# Patient Record
Sex: Female | Born: 1950 | Hispanic: No | Marital: Married | Smoking: Never smoker
Health system: Southern US, Community
[De-identification: ages and names within clinical notes are randomized; demographics above are authoritative.]

## PROBLEM LIST (undated history)

## (undated) DIAGNOSIS — M419 Scoliosis, unspecified: Secondary | ICD-10-CM

## (undated) DIAGNOSIS — E785 Hyperlipidemia, unspecified: Secondary | ICD-10-CM

## (undated) DIAGNOSIS — M2540 Effusion, unspecified joint: Secondary | ICD-10-CM

## (undated) DIAGNOSIS — F329 Major depressive disorder, single episode, unspecified: Secondary | ICD-10-CM

## (undated) DIAGNOSIS — R350 Frequency of micturition: Secondary | ICD-10-CM

## (undated) DIAGNOSIS — I10 Essential (primary) hypertension: Secondary | ICD-10-CM

## (undated) DIAGNOSIS — M1990 Unspecified osteoarthritis, unspecified site: Secondary | ICD-10-CM

## (undated) DIAGNOSIS — J45909 Unspecified asthma, uncomplicated: Secondary | ICD-10-CM

## (undated) DIAGNOSIS — Z8659 Personal history of other mental and behavioral disorders: Secondary | ICD-10-CM

## (undated) DIAGNOSIS — R51 Headache: Secondary | ICD-10-CM

## (undated) DIAGNOSIS — M2550 Pain in unspecified joint: Secondary | ICD-10-CM

## (undated) DIAGNOSIS — F32A Depression, unspecified: Secondary | ICD-10-CM

## (undated) DIAGNOSIS — Z8709 Personal history of other diseases of the respiratory system: Secondary | ICD-10-CM

## (undated) DIAGNOSIS — M4050 Lordosis, unspecified, site unspecified: Secondary | ICD-10-CM

## (undated) DIAGNOSIS — R0602 Shortness of breath: Secondary | ICD-10-CM

## (undated) HISTORY — PX: CHOLECYSTECTOMY: SHX55

## (undated) HISTORY — PX: JOINT REPLACEMENT: SHX530

## (undated) HISTORY — PX: DILATION AND CURETTAGE OF UTERUS: SHX78

## (undated) HISTORY — PX: VAGINAL HYSTERECTOMY: SUR661

## (undated) HISTORY — PX: RHINOPLASTY: SUR1284

## (undated) HISTORY — PX: ADENOIDECTOMY: SUR15

## (undated) HISTORY — PX: KNEE ARTHROSCOPY: SUR90

## (undated) HISTORY — PX: TONSILLECTOMY: SUR1361

---

## 2000-06-19 ENCOUNTER — Other Ambulatory Visit: Admission: RE | Admit: 2000-06-19 | Discharge: 2000-06-19 | Payer: Self-pay | Admitting: Obstetrics & Gynecology

## 2001-09-07 ENCOUNTER — Other Ambulatory Visit: Admission: RE | Admit: 2001-09-07 | Discharge: 2001-09-07 | Payer: Self-pay | Admitting: Obstetrics & Gynecology

## 2002-06-15 ENCOUNTER — Observation Stay (HOSPITAL_COMMUNITY): Admission: RE | Admit: 2002-06-15 | Discharge: 2002-06-16 | Payer: Self-pay | Admitting: Obstetrics & Gynecology

## 2002-06-29 ENCOUNTER — Inpatient Hospital Stay (HOSPITAL_COMMUNITY): Admission: AD | Admit: 2002-06-29 | Discharge: 2002-06-29 | Payer: Self-pay | Admitting: Obstetrics and Gynecology

## 2004-05-13 HISTORY — PX: OTHER SURGICAL HISTORY: SHX169

## 2005-10-16 ENCOUNTER — Ambulatory Visit (HOSPITAL_BASED_OUTPATIENT_CLINIC_OR_DEPARTMENT_OTHER): Admission: RE | Admit: 2005-10-16 | Discharge: 2005-10-16 | Payer: Self-pay | Admitting: Orthopedic Surgery

## 2008-05-13 DIAGNOSIS — Z8709 Personal history of other diseases of the respiratory system: Secondary | ICD-10-CM

## 2008-05-13 HISTORY — DX: Personal history of other diseases of the respiratory system: Z87.09

## 2013-10-18 ENCOUNTER — Other Ambulatory Visit (HOSPITAL_COMMUNITY): Payer: Self-pay | Admitting: Orthopedic Surgery

## 2013-11-10 ENCOUNTER — Encounter (HOSPITAL_COMMUNITY): Payer: Self-pay | Admitting: Pharmacy Technician

## 2013-11-10 ENCOUNTER — Other Ambulatory Visit (HOSPITAL_COMMUNITY): Payer: Self-pay | Admitting: Orthopedic Surgery

## 2013-11-11 ENCOUNTER — Encounter (HOSPITAL_COMMUNITY)
Admission: RE | Admit: 2013-11-11 | Discharge: 2013-11-11 | Disposition: A | Discharge status: Home / Self Care | Payer: BC Managed Care – PPO | Source: Ambulatory Visit | Attending: Orthopedic Surgery | Admitting: Orthopedic Surgery

## 2013-11-11 ENCOUNTER — Encounter (HOSPITAL_COMMUNITY): Payer: Self-pay

## 2013-11-11 ENCOUNTER — Ambulatory Visit (HOSPITAL_COMMUNITY)
Admission: RE | Admit: 2013-11-11 | Discharge: 2013-11-11 | Disposition: A | Discharge status: Home / Self Care | Payer: BC Managed Care – PPO | Source: Ambulatory Visit | Attending: Orthopedic Surgery | Admitting: Orthopedic Surgery

## 2013-11-11 DIAGNOSIS — Z01818 Encounter for other preprocedural examination: Secondary | ICD-10-CM | POA: Diagnosis not present

## 2013-11-11 HISTORY — DX: Personal history of other diseases of the respiratory system: Z87.09

## 2013-11-11 HISTORY — DX: Unspecified osteoarthritis, unspecified site: M19.90

## 2013-11-11 HISTORY — DX: Essential (primary) hypertension: I10

## 2013-11-11 HISTORY — DX: Major depressive disorder, single episode, unspecified: F32.9

## 2013-11-11 HISTORY — DX: Personal history of other mental and behavioral disorders: Z86.59

## 2013-11-11 HISTORY — DX: Pain in unspecified joint: M25.50

## 2013-11-11 HISTORY — DX: Hyperlipidemia, unspecified: E78.5

## 2013-11-11 HISTORY — DX: Scoliosis, unspecified: M41.9

## 2013-11-11 HISTORY — DX: Lordosis, unspecified, site unspecified: M40.50

## 2013-11-11 HISTORY — DX: Effusion, unspecified joint: M25.40

## 2013-11-11 HISTORY — DX: Depression, unspecified: F32.A

## 2013-11-11 HISTORY — DX: Shortness of breath: R06.02

## 2013-11-11 HISTORY — DX: Unspecified asthma, uncomplicated: J45.909

## 2013-11-11 HISTORY — DX: Frequency of micturition: R35.0

## 2013-11-11 HISTORY — DX: Headache: R51

## 2013-11-11 LAB — CBC
HCT: 41.5 % (ref 36.0–46.0)
Hemoglobin: 13.7 g/dL (ref 12.0–15.0)
MCH: 31 pg (ref 26.0–34.0)
MCHC: 33 g/dL (ref 30.0–36.0)
MCV: 93.9 fL (ref 78.0–100.0)
PLATELETS: 233 10*3/uL (ref 150–400)
RBC: 4.42 MIL/uL (ref 3.87–5.11)
RDW: 12.9 % (ref 11.5–15.5)
WBC: 9.3 10*3/uL (ref 4.0–10.5)

## 2013-11-11 LAB — SURGICAL PCR SCREEN
MRSA, PCR: NEGATIVE
STAPHYLOCOCCUS AUREUS: NEGATIVE

## 2013-11-11 LAB — COMPREHENSIVE METABOLIC PANEL
ALT: 46 U/L — ABNORMAL HIGH (ref 0–35)
AST: 40 U/L — AB (ref 0–37)
Albumin: 4.4 g/dL (ref 3.5–5.2)
Alkaline Phosphatase: 133 U/L — ABNORMAL HIGH (ref 39–117)
Anion gap: 19 — ABNORMAL HIGH (ref 5–15)
BUN: 16 mg/dL (ref 6–23)
CHLORIDE: 98 meq/L (ref 96–112)
CO2: 23 mEq/L (ref 19–32)
Calcium: 9.7 mg/dL (ref 8.4–10.5)
Creatinine, Ser: 0.67 mg/dL (ref 0.50–1.10)
GFR calc Af Amer: >90 mL/min (ref >90)
GFR calc non Af Amer: >90 mL/min (ref >90)
Glucose, Bld: 79 mg/dL (ref 70–99)
Potassium: 3.3 mEq/L — ABNORMAL LOW (ref 3.7–5.3)
Sodium: 140 mEq/L (ref 137–147)
Total Bilirubin: 0.3 mg/dL (ref 0.3–1.2)
Total Protein: 7.7 g/dL (ref 6.0–8.3)

## 2013-11-11 LAB — APTT: aPTT: 30 seconds (ref 24–37)

## 2013-11-11 LAB — TYPE AND SCREEN
ABO/RH(D): O POS
Antibody Screen: NEGATIVE

## 2013-11-11 LAB — PROTIME-INR
INR: 1.04 (ref 0.00–1.49)
Prothrombin Time: 13.6 seconds (ref 11.6–15.2)

## 2013-11-11 NOTE — Progress Notes (Addendum)
  Pt doesn't have a cardiologist  Denies ever having an echo/stress test/heart cath  Denies EKG or CXR in past yr  Medical Md is Dr.Lisa Cassidy-VU

## 2013-11-11 NOTE — Pre-Procedure Instructions (Signed)
Cathy ConnersKatherine Liu  11/11/2013   Your procedure is scheduled on:  Wed, July 15 @ 8:30 AM  [facility]  at 6:30 AM.  Call this number if you have problems the morning of surgery: [phone]   Remember:   Do not eat food or drink liquids after midnight.   Take these medicines the morning of surgery with A SIP OF WATER: Albuterol<Bring Your Inhaler With You> and Pain Pill(if needed)               No Goody's,BC's,Aleve,Aspirin,Ibuprofen,Fish Oil,or any Herbal Medications   Do not wear jewelry, make-up or nail polish.  Do not wear lotions, powders, or perfumes. You may wear deodorant.  Do not shave 48 hours prior to surgery.   Do not bring valuables to the hospital.  [facility]                  for any belongings or valuables.               Contacts, dentures or bridgework may not be worn into surgery.  Leave suitcase in the car. After surgery it may be brought to your room.  For patients admitted to the hospital, discharge time is determined by your                treatment team.             Special Instructions:  [place] - Preparing for Surgery  Before surgery, you can play an important role.  Because skin is not sterile, your skin needs to be as free of germs as possible.  You can reduce the number of germs on you skin by washing with CHG (chlorahexidine gluconate) soap before surgery.  CHG is an antiseptic cleaner which kills germs and bonds with the skin to continue killing germs even after washing.  Please DO NOT use if you have an allergy to CHG or antibacterial soaps.  If your skin becomes reddened/irritated stop using the CHG and inform your nurse when you arrive at Short Stay.  Do not shave (including legs and underarms) for at least 48 hours prior to the first CHG shower.  You may shave your face.  Please follow these instructions carefully:   1.  Shower with CHG Soap the night before surgery and the                                 morning of Surgery.  2.  If you choose to wash your hair, wash your hair first as usual with your       normal shampoo.  3.  After you shampoo, rinse your hair and body thoroughly to remove the                      Shampoo.  4.  Use CHG as you would any other liquid soap.  You can apply chg directly       to the skin and wash gently with scrungie or a clean washcloth.  5.  Apply the CHG Soap to your body ONLY FROM THE NECK DOWN.        Do not use on open wounds or open sores.  Avoid contact with your eyes,       ears, mouth and genitals (private parts).  Wash genitals (private parts)       with your  normal soap.  6.  Wash thoroughly, paying special attention to the area where your surgery        will be performed.  7.  Thoroughly rinse your body with warm water from the neck down.  8.  DO NOT shower/wash with your normal soap after using and rinsing off       the CHG Soap.  9.  Pat yourself dry with a clean towel.            10.  Wear clean pajamas.            11.  Place clean sheets on your bed the night of your first shower and do not        sleep with pets.  Day of Surgery  Do not apply any lotions/deoderants the morning of surgery.  Please wear clean clothes to the hospital/surgery center.     Please read over the following fact sheets that you were given: Pain Booklet, Coughing and Deep Breathing, Blood Transfusion Information, MRSA Information and Surgical Site Infection Prevention

## 2013-11-12 LAB — ABO/RH: ABO/RH(D): O POS

## 2013-11-23 MED ORDER — CEFAZOLIN SODIUM-DEXTROSE 2-3 GM-% IV SOLR
2.0000 g | INTRAVENOUS | Status: AC
  Administered 2013-11-24: 2 g via INTRAVENOUS
  Filled 2013-11-23: qty 50

## 2013-11-24 ENCOUNTER — Encounter (HOSPITAL_COMMUNITY): Payer: Self-pay | Admitting: Anesthesiology

## 2013-11-24 ENCOUNTER — Ambulatory Visit (HOSPITAL_COMMUNITY): Payer: BC Managed Care – PPO | Admitting: Anesthesiology

## 2013-11-24 ENCOUNTER — Encounter (HOSPITAL_COMMUNITY): Payer: BC Managed Care – PPO | Admitting: Anesthesiology

## 2013-11-24 ENCOUNTER — Inpatient Hospital Stay (HOSPITAL_COMMUNITY)
Admission: RE | Admit: 2013-11-24 | Discharge: 2013-11-27 | DRG: 470 | Disposition: A | Discharge status: Home Health | Payer: BC Managed Care – PPO | Source: Ambulatory Visit | Attending: Orthopedic Surgery | Admitting: Orthopedic Surgery

## 2013-11-24 ENCOUNTER — Encounter (HOSPITAL_COMMUNITY)
Admission: RE | Disposition: A | Discharge status: Home Health | Payer: Self-pay | Source: Ambulatory Visit | Attending: Orthopedic Surgery

## 2013-11-24 DIAGNOSIS — F3289 Other specified depressive episodes: Secondary | ICD-10-CM | POA: Diagnosis present

## 2013-11-24 DIAGNOSIS — J45909 Unspecified asthma, uncomplicated: Secondary | ICD-10-CM | POA: Diagnosis present

## 2013-11-24 DIAGNOSIS — R51 Headache: Secondary | ICD-10-CM | POA: Diagnosis present

## 2013-11-24 DIAGNOSIS — Z96659 Presence of unspecified artificial knee joint: Secondary | ICD-10-CM

## 2013-11-24 DIAGNOSIS — F329 Major depressive disorder, single episode, unspecified: Secondary | ICD-10-CM | POA: Diagnosis present

## 2013-11-24 DIAGNOSIS — Z96652 Presence of left artificial knee joint: Secondary | ICD-10-CM

## 2013-11-24 DIAGNOSIS — I10 Essential (primary) hypertension: Secondary | ICD-10-CM | POA: Diagnosis present

## 2013-11-24 DIAGNOSIS — M179 Osteoarthritis of knee, unspecified: Secondary | ICD-10-CM | POA: Diagnosis present

## 2013-11-24 DIAGNOSIS — M1710 Unilateral primary osteoarthritis, unspecified knee: Principal | ICD-10-CM | POA: Diagnosis present

## 2013-11-24 HISTORY — PX: TOTAL KNEE ARTHROPLASTY: SHX125

## 2013-11-24 SURGERY — ARTHROPLASTY, KNEE, TOTAL
Anesthesia: Monitor Anesthesia Care | Site: Knee | Laterality: Left

## 2013-11-24 MED ORDER — ACETAMINOPHEN 325 MG PO TABS: 650.0000 mg | ORAL_TABLET | Freq: Four times a day (QID) | ORAL | Status: DC | PRN

## 2013-11-24 MED ORDER — MIDAZOLAM HCL 2 MG/2ML IJ SOLN
INTRAMUSCULAR | Status: AC
  Filled 2013-11-24: qty 2

## 2013-11-24 MED ORDER — PROPOFOL INFUSION 10 MG/ML OPTIME
INTRAVENOUS | Status: DC | PRN
  Administered 2013-11-24: 75 ug/kg/min via INTRAVENOUS

## 2013-11-24 MED ORDER — PROPOFOL 10 MG/ML IV BOLUS
INTRAVENOUS | Status: AC
  Filled 2013-11-24: qty 20

## 2013-11-24 MED ORDER — BUPIVACAINE IN DEXTROSE 0.75-8.25 % IT SOLN
INTRATHECAL | Status: DC | PRN
  Administered 2013-11-24: 1.6 mL via INTRATHECAL

## 2013-11-24 MED ORDER — BUPIVACAINE LIPOSOME 1.3 % IJ SUSP
20.0000 mL | INTRAMUSCULAR | Status: DC
  Filled 2013-11-24: qty 20

## 2013-11-24 MED ORDER — MAGNESIUM CITRATE PO SOLN: 1.0000 | Freq: Once | ORAL | Status: AC | PRN

## 2013-11-24 MED ORDER — BUPIVACAINE LIPOSOME 1.3 % IJ SUSP
INTRAMUSCULAR | Status: DC | PRN
  Administered 2013-11-24: 20 mL

## 2013-11-24 MED ORDER — METHOCARBAMOL 1000 MG/10ML IJ SOLN
500.0000 mg | Freq: Four times a day (QID) | INTRAVENOUS | Status: DC | PRN
  Filled 2013-11-24: qty 5

## 2013-11-24 MED ORDER — PHENYLEPHRINE HCL 10 MG/ML IJ SOLN
INTRAMUSCULAR | Status: DC | PRN
  Administered 2013-11-24: 40 ug via INTRAVENOUS
  Administered 2013-11-24: 80 ug via INTRAVENOUS
  Administered 2013-11-24 (×2): 40 ug via INTRAVENOUS

## 2013-11-24 MED ORDER — DIPHENHYDRAMINE HCL 12.5 MG/5ML PO ELIX
12.5000 mg | ORAL_SOLUTION | ORAL | Status: DC | PRN
  Administered 2013-11-24: 25 mg via ORAL
  Administered 2013-11-25 (×2): 12.5 mg via ORAL
  Administered 2013-11-26: 25 mg via ORAL
  Administered 2013-11-27: 12.5 mg via ORAL
  Filled 2013-11-24 (×5): qty 10

## 2013-11-24 MED ORDER — SODIUM CHLORIDE 0.9 % IR SOLN
Status: DC | PRN
  Administered 2013-11-24 (×2): 1000 mL

## 2013-11-24 MED ORDER — KETOROLAC TROMETHAMINE 30 MG/ML IJ SOLN: 15.0000 mg | Freq: Once | INTRAMUSCULAR | Status: DC | PRN

## 2013-11-24 MED ORDER — EPHEDRINE SULFATE 50 MG/ML IJ SOLN
INTRAMUSCULAR | Status: AC
  Filled 2013-11-24: qty 1

## 2013-11-24 MED ORDER — ONDANSETRON HCL 4 MG/2ML IJ SOLN: 4.0000 mg | Freq: Once | INTRAMUSCULAR | Status: DC | PRN

## 2013-11-24 MED ORDER — TOPIRAMATE 100 MG PO TABS
100.0000 mg | ORAL_TABLET | Freq: Every day | ORAL | Status: DC
  Administered 2013-11-24 – 2013-11-26 (×3): 100 mg via ORAL
  Filled 2013-11-24 (×4): qty 1

## 2013-11-24 MED ORDER — METOCLOPRAMIDE HCL 10 MG PO TABS: 5.0000 mg | ORAL_TABLET | Freq: Three times a day (TID) | ORAL | Status: DC | PRN

## 2013-11-24 MED ORDER — PHENOL 1.4 % MT LIQD: 1.0000 | OROMUCOSAL | Status: DC | PRN

## 2013-11-24 MED ORDER — HYDROMORPHONE HCL PF 1 MG/ML IJ SOLN: 0.2500 mg | INTRAMUSCULAR | Status: DC | PRN

## 2013-11-24 MED ORDER — FENTANYL CITRATE 0.05 MG/ML IJ SOLN
INTRAMUSCULAR | Status: DC | PRN
  Administered 2013-11-24 (×2): 50 ug via INTRAVENOUS

## 2013-11-24 MED ORDER — SUCCINYLCHOLINE CHLORIDE 20 MG/ML IJ SOLN
INTRAMUSCULAR | Status: AC
  Filled 2013-11-24: qty 1

## 2013-11-24 MED ORDER — KETOROLAC TROMETHAMINE 15 MG/ML IJ SOLN
15.0000 mg | Freq: Four times a day (QID) | INTRAMUSCULAR | Status: AC
  Administered 2013-11-24 – 2013-11-25 (×4): 15 mg via INTRAVENOUS
  Filled 2013-11-24 (×4): qty 1

## 2013-11-24 MED ORDER — ONDANSETRON HCL 4 MG/2ML IJ SOLN
INTRAMUSCULAR | Status: AC
  Filled 2013-11-24: qty 2

## 2013-11-24 MED ORDER — SODIUM CHLORIDE 0.9 % IJ SOLN
INTRAMUSCULAR | Status: AC
  Filled 2013-11-24: qty 10

## 2013-11-24 MED ORDER — BISACODYL 5 MG PO TBEC: 5.0000 mg | DELAYED_RELEASE_TABLET | Freq: Every day | ORAL | Status: DC | PRN

## 2013-11-24 MED ORDER — SODIUM CHLORIDE 0.9 % IJ SOLN
INTRAMUSCULAR | Status: DC | PRN
  Administered 2013-11-24: 40 mL

## 2013-11-24 MED ORDER — DOCUSATE SODIUM 100 MG PO CAPS
100.0000 mg | ORAL_CAPSULE | Freq: Two times a day (BID) | ORAL | Status: DC
  Administered 2013-11-24 – 2013-11-27 (×6): 100 mg via ORAL
  Filled 2013-11-24 (×7): qty 1

## 2013-11-24 MED ORDER — SUMATRIPTAN SUCCINATE 50 MG PO TABS
50.0000 mg | ORAL_TABLET | ORAL | Status: DC | PRN
  Filled 2013-11-24: qty 1

## 2013-11-24 MED ORDER — ONDANSETRON HCL 4 MG/2ML IJ SOLN
4.0000 mg | Freq: Four times a day (QID) | INTRAMUSCULAR | Status: DC | PRN
  Administered 2013-11-27: 4 mg via INTRAVENOUS
  Filled 2013-11-24: qty 2

## 2013-11-24 MED ORDER — HYDROMORPHONE HCL PF 1 MG/ML IJ SOLN
1.0000 mg | INTRAMUSCULAR | Status: DC | PRN
  Administered 2013-11-24 – 2013-11-26 (×10): 1 mg via INTRAVENOUS
  Filled 2013-11-24 (×10): qty 1

## 2013-11-24 MED ORDER — ACETAMINOPHEN 160 MG/5ML PO SOLN
325.0000 mg | ORAL | Status: DC | PRN
  Filled 2013-11-24: qty 20.3

## 2013-11-24 MED ORDER — SODIUM CHLORIDE 0.9 % IV SOLN
INTRAVENOUS | Status: DC
  Administered 2013-11-24: 23:00:00 via INTRAVENOUS

## 2013-11-24 MED ORDER — FENTANYL CITRATE 0.05 MG/ML IJ SOLN
INTRAMUSCULAR | Status: AC
  Filled 2013-11-24: qty 5

## 2013-11-24 MED ORDER — ACETAMINOPHEN 325 MG PO TABS: 325.0000 mg | ORAL_TABLET | ORAL | Status: DC | PRN

## 2013-11-24 MED ORDER — HYDROCODONE-ACETAMINOPHEN 7.5-325 MG PO TABS
1.0000 | ORAL_TABLET | ORAL | Status: DC | PRN
  Administered 2013-11-25 – 2013-11-27 (×10): 2 via ORAL
  Filled 2013-11-24 (×10): qty 2

## 2013-11-24 MED ORDER — LACTATED RINGERS IV SOLN
INTRAVENOUS | Status: DC | PRN
  Administered 2013-11-24 (×2): via INTRAVENOUS

## 2013-11-24 MED ORDER — ROCURONIUM BROMIDE 50 MG/5ML IV SOLN
INTRAVENOUS | Status: AC
  Filled 2013-11-24: qty 1

## 2013-11-24 MED ORDER — ROPIVACAINE HCL 5 MG/ML IJ SOLN
INTRAMUSCULAR | Status: DC | PRN
  Administered 2013-11-24: 20 mL via PERINEURAL

## 2013-11-24 MED ORDER — SENNA 8.6 MG PO TABS
1.0000 | ORAL_TABLET | Freq: Two times a day (BID) | ORAL | Status: DC
  Administered 2013-11-24 – 2013-11-27 (×7): 8.6 mg via ORAL
  Filled 2013-11-24 (×8): qty 1

## 2013-11-24 MED ORDER — METOCLOPRAMIDE HCL 5 MG/ML IJ SOLN: 5.0000 mg | Freq: Three times a day (TID) | INTRAMUSCULAR | Status: DC | PRN

## 2013-11-24 MED ORDER — SERTRALINE HCL 50 MG PO TABS
50.0000 mg | ORAL_TABLET | Freq: Every day | ORAL | Status: DC
  Administered 2013-11-24 – 2013-11-26 (×3): 50 mg via ORAL
  Filled 2013-11-24 (×4): qty 1

## 2013-11-24 MED ORDER — ENALAPRIL MALEATE 5 MG PO TABS
5.0000 mg | ORAL_TABLET | Freq: Every day | ORAL | Status: DC
  Administered 2013-11-24 – 2013-11-27 (×3): 5 mg via ORAL
  Filled 2013-11-24 (×4): qty 1

## 2013-11-24 MED ORDER — EPHEDRINE SULFATE 50 MG/ML IJ SOLN
INTRAMUSCULAR | Status: DC | PRN
  Administered 2013-11-24: 5 mg via INTRAVENOUS

## 2013-11-24 MED ORDER — ONDANSETRON HCL 4 MG PO TABS: 4.0000 mg | ORAL_TABLET | Freq: Four times a day (QID) | ORAL | Status: DC | PRN

## 2013-11-24 MED ORDER — PHENYLEPHRINE 40 MCG/ML (10ML) SYRINGE FOR IV PUSH (FOR BLOOD PRESSURE SUPPORT)
PREFILLED_SYRINGE | INTRAVENOUS | Status: AC
  Filled 2013-11-24: qty 10

## 2013-11-24 MED ORDER — GLYCOPYRROLATE 0.2 MG/ML IJ SOLN
INTRAMUSCULAR | Status: AC
  Filled 2013-11-24: qty 1

## 2013-11-24 MED ORDER — MENTHOL 3 MG MT LOZG: 1.0000 | LOZENGE | OROMUCOSAL | Status: DC | PRN

## 2013-11-24 MED ORDER — ASPIRIN EC 325 MG PO TBEC
325.0000 mg | DELAYED_RELEASE_TABLET | Freq: Every day | ORAL | Status: DC
  Administered 2013-11-25 – 2013-11-27 (×3): 325 mg via ORAL
  Filled 2013-11-24 (×4): qty 1

## 2013-11-24 MED ORDER — METHOCARBAMOL 500 MG PO TABS
500.0000 mg | ORAL_TABLET | Freq: Four times a day (QID) | ORAL | Status: DC | PRN
  Administered 2013-11-24 – 2013-11-27 (×9): 500 mg via ORAL
  Filled 2013-11-24 (×9): qty 1

## 2013-11-24 MED ORDER — CEFAZOLIN SODIUM 1-5 GM-% IV SOLN
1.0000 g | Freq: Four times a day (QID) | INTRAVENOUS | Status: AC
  Administered 2013-11-24 (×2): 1 g via INTRAVENOUS
  Filled 2013-11-24 (×2): qty 50

## 2013-11-24 MED ORDER — MIDAZOLAM HCL 5 MG/5ML IJ SOLN
INTRAMUSCULAR | Status: DC | PRN
  Administered 2013-11-24: 2 mg via INTRAVENOUS

## 2013-11-24 MED ORDER — ACETAMINOPHEN 650 MG RE SUPP: 650.0000 mg | Freq: Four times a day (QID) | RECTAL | Status: DC | PRN

## 2013-11-24 MED ORDER — LIDOCAINE HCL (CARDIAC) 20 MG/ML IV SOLN
INTRAVENOUS | Status: AC
  Filled 2013-11-24: qty 5

## 2013-11-24 MED ORDER — ONDANSETRON HCL 4 MG/2ML IJ SOLN
INTRAMUSCULAR | Status: DC | PRN
  Administered 2013-11-24: 4 mg via INTRAVENOUS

## 2013-11-24 MED ORDER — ALBUTEROL SULFATE (2.5 MG/3ML) 0.083% IN NEBU: 3.0000 mL | INHALATION_SOLUTION | Freq: Four times a day (QID) | RESPIRATORY_TRACT | Status: DC | PRN

## 2013-11-24 SURGICAL SUPPLY — 48 items
BLADE SAGITTAL 25.0X1.27X90 (BLADE) ×2 IMPLANT
BLADE SAGITTAL 25.0X1.27X90MM (BLADE) ×1
BLADE SAW SGTL 13.0X1.19X90.0M (BLADE) ×3 IMPLANT
BLADE SURG 21 STRL SS (BLADE) ×6 IMPLANT
BNDG COHESIVE 6X5 TAN STRL LF (GAUZE/BANDAGES/DRESSINGS) ×3 IMPLANT
BONE CEMENT PALACOSE (Orthopedic Implant) ×6 IMPLANT
BOWL SMART MIX CTS (DISPOSABLE) ×3 IMPLANT
CAP POR TM CP VIT E LN CER HD ×3 IMPLANT
CEMENT BONE PALACOSE (Orthopedic Implant) ×2 IMPLANT
COVER SURGICAL LIGHT HANDLE (MISCELLANEOUS) ×3 IMPLANT
CUFF TOURNIQUET SINGLE 34IN LL (TOURNIQUET CUFF) IMPLANT
CUFF TOURNIQUET SINGLE 44IN (TOURNIQUET CUFF) IMPLANT
DRAPE EXTREMITY T 121X128X90 (DRAPE) ×3 IMPLANT
DRAPE PROXIMA HALF (DRAPES) ×3 IMPLANT
DRAPE U-SHAPE 47X51 STRL (DRAPES) ×3 IMPLANT
DRSG ADAPTIC 3X8 NADH LF (GAUZE/BANDAGES/DRESSINGS) ×3 IMPLANT
DRSG PAD ABDOMINAL 8X10 ST (GAUZE/BANDAGES/DRESSINGS) ×3 IMPLANT
DURAPREP 26ML APPLICATOR (WOUND CARE) ×3 IMPLANT
ELECT REM PT RETURN 9FT ADLT (ELECTROSURGICAL) ×3
ELECTRODE REM PT RTRN 9FT ADLT (ELECTROSURGICAL) ×1 IMPLANT
FACESHIELD WRAPAROUND (MASK) ×3 IMPLANT
GLOVE BIOGEL PI IND STRL 9 (GLOVE) ×1 IMPLANT
GLOVE BIOGEL PI INDICATOR 9 (GLOVE) ×2
GLOVE SURG ORTHO 9.0 STRL STRW (GLOVE) ×3 IMPLANT
GOWN STRL REUS W/ TWL XL LVL3 (GOWN DISPOSABLE) ×3 IMPLANT
GOWN STRL REUS W/TWL XL LVL3 (GOWN DISPOSABLE) ×6
HANDPIECE INTERPULSE COAX TIP (DISPOSABLE) ×2
KIT BASIN OR (CUSTOM PROCEDURE TRAY) ×3 IMPLANT
KIT ROOM TURNOVER OR (KITS) ×3 IMPLANT
MANIFOLD NEPTUNE II (INSTRUMENTS) ×3 IMPLANT
NEEDLE SPNL 18GX3.5 QUINCKE PK (NEEDLE) ×3 IMPLANT
NS IRRIG 1000ML POUR BTL (IV SOLUTION) ×3 IMPLANT
PACK TOTAL JOINT (CUSTOM PROCEDURE TRAY) ×3 IMPLANT
PAD ARMBOARD 7.5X6 YLW CONV (MISCELLANEOUS) ×6 IMPLANT
PADDING CAST COTTON 6X4 STRL (CAST SUPPLIES) ×3 IMPLANT
SET HNDPC FAN SPRY TIP SCT (DISPOSABLE) ×1 IMPLANT
SPONGE GAUZE 4X4 12PLY (GAUZE/BANDAGES/DRESSINGS) ×3 IMPLANT
STAPLER VISISTAT 35W (STAPLE) ×3 IMPLANT
SUCTION FRAZIER TIP 10 FR DISP (SUCTIONS) IMPLANT
SUT VIC AB 0 CTB1 27 (SUTURE) IMPLANT
SUT VIC AB 1 CTX 36 (SUTURE)
SUT VIC AB 1 CTX36XBRD ANBCTR (SUTURE) IMPLANT
SYR 50ML LL SCALE MARK (SYRINGE) ×3 IMPLANT
TOWEL OR 17X24 6PK STRL BLUE (TOWEL DISPOSABLE) ×3 IMPLANT
TOWEL OR 17X26 10 PK STRL BLUE (TOWEL DISPOSABLE) ×3 IMPLANT
TRAY FOLEY CATH 16FRSI W/METER (SET/KITS/TRAYS/PACK) IMPLANT
WATER STERILE IRR 1000ML POUR (IV SOLUTION) ×6 IMPLANT
WRAP KNEE MAXI GEL POST OP (GAUZE/BANDAGES/DRESSINGS) ×3 IMPLANT

## 2013-11-24 NOTE — Evaluation (Signed)
Physical Therapy Evaluation Patient Details Name: Cathy ConnersKatherine Liu MRN: [id number] DOB: August 24, 1950 Today's Date: 11/24/2013   History of Present Illness  s/p Lt TKA   Clinical Impression  Pt is s/p Lt TKA POD#0 resulting in the deficits listed below (see PT Problem List). Pt will benefit from skilled PT to increase their independence and safety with mobility to allow discharge to the venue listed below. Pt became very frustrated secondary to not being able to ambulate increased distance due to Lt LE buckling. Anticipate good progress due to incr motivation.      Follow Up Recommendations Home health PT;Supervision/Assistance - 24 hour    Equipment Recommendations  Rolling walker with 5" wheels;Standard walker    Recommendations for Other Services OT consult     Precautions / Restrictions Precautions Precautions: Knee;Fall Precaution Booklet Issued: Yes (comment) Precaution Comments: pt give TKA HEP; educated on no pillow under knee  Restrictions Weight Bearing Restrictions: Yes LLE Weight Bearing: Weight bearing as tolerated      Mobility  Bed Mobility Overal bed mobility: Needs Assistance Bed Mobility: Supine to Sit;Sit to Supine     Supine to sit: HOB elevated;Supervision Sit to supine: Supervision   General bed mobility comments: pt able to advance Lt LE to/off EOB with incr time and use of UEs; pt relies on handrails and HOB elevated; cues for sequencing   Transfers Overall transfer level: Needs assistance Equipment used: Rolling walker (2 wheeled) Transfers: Sit to/from [employer] CorporationStand;Stand Pivot Transfers Sit to Stand: Min assist Stand pivot transfers: Min assist       General transfer comment: pt transferred bed <> BSC; min (A) to maintain balance and manage RW; cues for hand placement and sequencing   Ambulation/Gait             General Gait Details: limited to pivotal steps due to buckling   Stairs            Wheelchair Mobility    Modified  Rankin (Stroke Patients Only)       Balance Overall balance assessment: Needs assistance Sitting-balance support: Feet supported;Single extremity supported Sitting balance-Leahy Scale: Poor Sitting balance - Comments: relying on UE support    Standing balance support: During functional activity;Bilateral upper extremity supported Standing balance-Leahy Scale: Poor Standing balance comment: relies on UE support and min (A) to maintain balance                              Pertinent Vitals/Pain 7/10. patient repositioned for comfort     Home Living Family/patient expects to be discharged to:: Private residence Living Arrangements: Spouse/significant other;Other relatives Available Help at Discharge: Family;Available 24 hours/day Type of Home: House Home Access: Stairs to enter Entrance Stairs-Rails: Right Entrance Stairs-Number of Steps: 5 Home Layout: One level Home Equipment: Walker - 2 wheels;Cane - single point      Prior Function Level of Independence: Independent with assistive device(s)         Comments: pt ambulated with cane due to pain; had not been driving secondary to pain      Hand Dominance        Extremity/Trunk Assessment   Upper Extremity Assessment: Defer to OT evaluation           Lower Extremity Assessment: LLE deficits/detail   LLE Deficits / Details: Lt LE buckling   Cervical / Trunk Assessment: Normal  Communication   Communication: No difficulties  Cognition Arousal/Alertness: Awake/alert Behavior During Therapy: [facility]  for tasks assessed/performed Overall Cognitive Status: Within Functional Limits for tasks assessed                      General Comments General comments (skin integrity, edema, etc.): pt reported she was very upset with herself; pt given encouragement throughout     Exercises Total Joint Exercises Ankle Circles/Pumps: AROM;Strengthening;Both;10 reps;Supine Quad Sets: AROM;Both;Left;Supine       Assessment/Plan    PT Assessment Patient needs continued PT services  PT Diagnosis Difficulty walking;Generalized weakness;Acute pain   PT Problem List Decreased strength;Decreased range of motion;Decreased activity tolerance;Decreased balance;Decreased mobility;Decreased knowledge of use of DME;Pain  PT Treatment Interventions DME instruction;Gait training;Stair training;Functional mobility training;Therapeutic activities;Therapeutic exercise;Balance training;Neuromuscular re-education;Patient/family education   PT Goals (Current goals can be found in the Care Plan section) Acute Rehab PT Goals Patient Stated Goal: to be independent again  PT Goal Formulation: With patient Time For Goal Achievement: 11/28/13 Potential to Achieve Goals: Good    Frequency 7X/week   Barriers to discharge        Co-evaluation               End of Session Equipment Utilized During Treatment: Gait belt Activity Tolerance: Patient limited by pain Patient left: in bed;with call bell/phone within reach;with family/visitor present Nurse Communication: Mobility status    Functional Assessment Tool Used: clinical judgement  Functional Limitation: Mobility: Walking and moving around Mobility: Walking and Moving Around Current Status [id number]): At least 20 percent but less than 40 percent impaired, limited or restricted Mobility: Walking and Moving Around Goal Status [id number]): At least 1 percent but less than 20 percent impaired, limited or restricted    Time: 1519-1540 PT Time Calculation (min): 21 min   Charges:   PT Evaluation $Initial PT Evaluation Tier I: 1 Procedure PT Treatments $Therapeutic Activity: 8-22 mins   PT G Codes:   Functional Assessment Tool Used: clinical judgement  Functional Limitation: Mobility: Walking and moving around    [place], [place], [place]  [phone] 11/24/2013, 4:42 PM

## 2013-11-24 NOTE — Transfer of Care (Signed)
Immediate Anesthesia Transfer of Care Note  Patient: Cathy ConnersKatherine Melendrez  Procedure(s) Performed: Procedure(s) with comments: TOTAL KNEE ARTHROPLASTY (Left) - Left Total Knee Arthroplasty, Removal Uni knee  Patient Location: PACU  Anesthesia Type:Spinal  Level of Consciousness: awake, alert , oriented and patient cooperative  Airway & Oxygen Therapy: Patient Spontanous Breathing  Post-op Assessment: Report given to PACU RN and Post -op Vital signs reviewed and stable  Post vital signs: Reviewed  Complications: No apparent anesthesia complications

## 2013-11-24 NOTE — Anesthesia Preprocedure Evaluation (Addendum)
Anesthesia Evaluation  Patient identified by MRN, date of birth, ID band Patient awake    Reviewed: Allergy & Precautions, H&P , NPO status , Patient's Chart, lab work & pertinent test results  History of Anesthesia Complications Negative for: history of anesthetic complications  Airway Mallampati: II TM Distance: >3 FB Neck ROM: Full    Dental  (+) Teeth Intact, Dental Advisory Given   Pulmonary asthma ,  breath sounds clear to auscultation        Cardiovascular hypertension, Pt. on medications - angina- Past MI and - CHF - dysrhythmias - Valvular Problems/MurmursRhythm:Regular Rate:Normal     Neuro/Psych  Headaches, PSYCHIATRIC DISORDERS Depression    GI/Hepatic negative GI ROS, Neg liver ROS,   Endo/Other  negative endocrine ROS  Renal/GU negative Renal ROS     Musculoskeletal  (+) Arthritis -, Osteoarthritis,    Abdominal   Peds  Hematology negative hematology ROS (+)   Anesthesia Other Findings   Reproductive/Obstetrics negative OB ROS                         Anesthesia Physical Anesthesia Plan  ASA: II  Anesthesia Plan: MAC, Regional and Spinal   Post-op Pain Management: MAC Combined w/ Regional for Post-op pain   Induction:   Airway Management Planned: Natural Airway and Simple Face Mask  Additional Equipment: None  Intra-op Plan:   Post-operative Plan:   Informed Consent: I have reviewed the patients History and Physical, chart, labs and discussed the procedure including the risks, benefits and alternatives for the proposed anesthesia with the patient or authorized representative who has indicated his/her understanding and acceptance.   Dental advisory given  Plan Discussed with: CRNA and Surgeon  Anesthesia Plan Comments:         Anesthesia Quick Evaluation

## 2013-11-24 NOTE — Anesthesia Procedure Notes (Addendum)
Anesthesia Regional Block:  Femoral nerve block  Pre-Anesthetic Checklist: ,, timeout performed, Correct Patient, Correct Site, Correct Laterality, Correct Procedure, Correct Position, site marked, Risks and benefits discussed,  Surgical consent,  Pre-op evaluation,  At surgeon's request and post-op pain management  Laterality: Lower and Left  Prep: chloraprep       Needles:  Injection technique: Single-shot  Needle Type: Echogenic Stimulator Needle          Additional Needles:  Procedures: ultrasound guided (picture in chart) and nerve stimulator Femoral nerve block  Nerve Stimulator or Paresthesia:  Response: quad, 0.48 mA,   Additional Responses:   Narrative:  Start time: 11/24/2013 8:09 AM End time: 11/24/2013 8:17 AM Injection made incrementally with aspirations every 5 mL.  Performed by: Personally  Anesthesiologist: Moser  Additional Notes: H+P and labs reviewed, risks and benefits discussed with patient, procedure tolerated well without complications   Spinal  Patient location during procedure: OR Start time: 11/24/2013 8:26 AM End time: 11/24/2013 8:31 AM Staffing Anesthesiologist: MOSER, CHRIS Preanesthetic Checklist Completed: patient identified, surgical consent, pre-op evaluation, timeout performed, IV checked, risks and benefits discussed and monitors and equipment checked Spinal Block Patient position: sitting Prep: site prepped and draped and DuraPrep Patient monitoring: heart rate, cardiac monitor, continuous pulse ox and blood pressure Approach: midline Location: L4-5 Injection technique: single-shot Needle Needle type: Pencan  Needle gauge: 24 G Needle length: 10 cm Assessment Sensory level: T8  Procedure Name: MAC Date/Time: 11/24/2013 8:30 AM Performed by: Lovie CholOCK, Rinda Rollyson K Pre-anesthesia Checklist: Patient identified, Emergency Drugs available, Suction available, Patient being monitored and Timeout performed Patient  Re-evaluated:Patient Re-evaluated prior to inductionOxygen Delivery Method: Simple face mask

## 2013-11-24 NOTE — Anesthesia Postprocedure Evaluation (Signed)
  Anesthesia Post-op Note  Patient: Farrel ConnersKatherine Hildreth  Procedure(s) Performed: Procedure(s) with comments: TOTAL KNEE ARTHROPLASTY (Left) - Left Total Knee Arthroplasty, Removal Uni knee  Patient Location: PACU  Anesthesia Type:MAC, Regional and Spinal  Level of Consciousness: awake and alert   Airway and Oxygen Therapy: Patient Spontanous Breathing  Post-op Pain: none  Post-op Assessment: Post-op Vital signs reviewed, Patient's Cardiovascular Status Stable, Respiratory Function Stable, Patent Airway, No signs of Nausea or vomiting and Pain level controlled  Post-op Vital Signs: Reviewed and stable  Last Vitals:  Filed Vitals:   11/24/13 1100  BP: 127/60  Pulse: 74  Temp: 36.7 C  Resp: 16    Complications: No apparent anesthesia complications

## 2013-11-24 NOTE — Progress Notes (Signed)
Orthopedic Tech Progress Note Patient Details:  Cathy ConnersKatherine Liu 21-Mar-1951 [id number]  Ortho Devices Ortho Device/Splint Location: foot roll Ortho Device/Splint Interventions: Application   Cammer, Mickie BailJennifer Carol 11/24/2013, 12:46 PM

## 2013-11-24 NOTE — Op Note (Signed)
11/24/2013  9:38 AM  PATIENT:  Cathy Liu    PRE-OPERATIVE DIAGNOSIS:  Left Knee Osteoarthritis s/p Uni Knee  POST-OPERATIVE DIAGNOSIS:  Same  PROCEDURE:  TOTAL KNEE ARTHROPLASTY Removal of unicompartmental arthroplasty  SURGEON:  Nadara MustardUDA,Greysyn Vanderberg V, MD  PHYSICIAN ASSISTANT:None ANESTHESIA:   General  PREOPERATIVE INDICATIONS:  Cathy ConnersKatherine Goodnow is a  63 y.o. female with a diagnosis of Left Knee Osteoarthritis s/p Uni Knee who failed conservative measures and elected for surgical management.    The risks benefits and alternatives were discussed with the patient preoperatively including but not limited to the risks of infection, bleeding, nerve injury, cardiopulmonary complications, the need for revision surgery, among others, and the patient was willing to proceed.  OPERATIVE IMPLANTS: Zimmer implants. Size C. femur. Size 4 tibia. A size 16 polyethylene. 29 mm patella.  OPERATIVE FINDINGS:  Osteoporosis of the bone  OPERATIVE PROCEDURE: Patient was brought to the operating room after undergoing a femoral block she then underwent spinal anesthetic. After adequate levels of anesthesia were obtained patient's left lower extremity was prepped using DuraPrep draped into a sterile field. Collier Flowersoban was used to cover all exposed skin. A timeout was called. A midline incision was made carried down to a medial parapatellar retinacular incision. The femoral and tibial components from the unicompartmental arthroplasty were removed without complications. Attention was first focused on the femur. Intramedullary guide was used to take 10 mm off the distal femur. Attention was then focused on the tibia and the external alignment guide was used to take 10 mm off the tibia neutral varus valgus 3 posterior slope. The tibia was sized for a size 4 and the cuts were made for the size 4 tibia. Attention was then focused on the femur and the femur measured for a size C. and the box cuts and chamfer cuts were made  for the size C. femur narrow. The trial components were placed in the knee was stable with a 16 mm polyethylene. The patella was resurfaced and 9 mm was taken off the patella for a 29 mm patella. The wound was irrigated with normal saline. The popliteal fossa was injected with Exparel 60 cc. The components were cemented in place the wound is irrigated with pulsatile lavage throughout the case the knee was left in extension with the patella clamped until the cement hardened. The knee was placed through full range of motion and the patella tracked midline. The retinaculum was closed using #1 Vicryl. Subcutaneous is closed using 0 Vicryl. Skin was closed using staples. Sterile compressive dressing was applied. Patient was taken to the PACU in stable condition.

## 2013-11-24 NOTE — H&P (Signed)
TOTAL KNEE ADMISSION H&P  Patient is being admitted for left total knee arthroplasty.  Subjective:  Chief Complaint:left knee pain.  HPI: Cathy Liu, 63 y.o. female, has a history of pain and functional disability in the left knee due to arthritis and has failed non-surgical conservative treatments for greater than 12 weeks to includeNSAID's and/or analgesics, corticosteriod injections, viscosupplementation injections, flexibility and strengthening excercises, use of assistive devices, weight reduction as appropriate and activity modification.  Onset of symptoms was gradual, starting 8 years ago with gradually worsening course since that time. The patient noted prior procedures on the knee to include  unicompartmental arthroplasty on the left knee(s).  Patient currently rates pain in the left knee(s) at 8 out of 10 with activity. Patient has night pain, worsening of pain with activity and weight bearing, pain that interferes with activities of daily living, pain with passive range of motion, crepitus and joint swelling.  Patient has evidence of subchondral cysts, subchondral sclerosis, periarticular osteophytes and joint space narrowing by imaging studies. This patient has had failure of unicompartmental arthroplasty. There is no active infection.  There are no active problems to display for this patient.  Past Medical History  Diagnosis Date  . Hypertension     takes Enalapril daily  . Depression     takes Zoloft daily  . Hyperlipidemia     borderline but no meds required  . History of bronchitis 2010  . Asthma     exercise induced;Albuterol inhaler prn  . Shortness of breath     with exertion  . Headache(784.0) last one many yrs ago    takes Topamax nightly and Imitrex daily  . Joint pain   . Joint swelling left knee  . Arthritis     back and knees  . Scoliosis   . Lordosis   . Urinary frequency   . Osteoarthritis   . History of panic attacks several yrs ago    Past  Surgical History  Procedure Laterality Date  . Adenoidectomy  as a child  . Tonsillectomy  as a child  . Rhinoplasty  as a child  . Dilation and curettage of uterus    . Cholecystectomy    . Vaginal hysterectomy      with tubes and ovaries  . Knee arthroscopy Bilateral   . Joint replacement Left   . Thyroid cyst drained  2006    Prescriptions prior to admission  Medication Sig Dispense Refill  . albuterol (PROAIR HFA) 108 (90 BASE) MCG/ACT inhaler Inhale into the lungs every 6 (six) hours as needed for wheezing or shortness of breath.      . Cholecalciferol (VITAMIN D) 2000 UNITS tablet Take 2,000 Units by mouth daily.      . enalapril (VASOTEC) 5 MG tablet Take 5 mg by mouth daily.      Marland Kitchen HYDROmorphone (DILAUDID) 4 MG tablet Take 2 mg by mouth every 4 (four) hours as needed for moderate pain or severe pain.      . Multiple Vitamins-Minerals (MULTIVITAMIN WITH MINERALS) tablet Take 1 tablet by mouth daily.      . sertraline (ZOLOFT) 50 MG tablet Take 50 mg by mouth at bedtime.      . SUMAtriptan (IMITREX) 50 MG tablet Take 50 mg by mouth every 2 (two) hours as needed for migraine or headache. May repeat in 2 hours if headache persists or recurs.      . topiramate (TOPAMAX) 100 MG tablet Take 100 mg by mouth at bedtime.      Marland Kitchen  diphenhydrAMINE (SOMINEX) 25 MG tablet Take 25 mg by mouth at bedtime as needed for sleep.       Allergies  Allergen Reactions  . Shellfish-Derived Products Anaphylaxis    CRAB MEAT  . Synvisc [Hylan G-F 20] Shortness Of Breath, Anxiety and Other (See Comments)    Verticulitis   . Chocolate Other (See Comments)    MIGRAINE   . Red Wine Complex [Germanium] Other (See Comments)    MIGRAINE     History  Substance Use Topics  . Smoking status: Never Smoker   . Smokeless tobacco: Not on file  . Alcohol Use: No    No family history on file.   Review of Systems  All other systems reviewed and are negative.   Objective:  Physical Exam  Vital signs in  last 24 hours:    Labs:   There is no height or weight on file to calculate BMI.   Imaging Review Plain radiographs demonstrate moderate degenerative joint disease of the left knee(s). The overall alignment ismild valgus. The bone quality appears to be adequate for age and reported activity level.  Assessment/Plan:  End stage arthritis, left knee   The patient history, physical examination, clinical judgment of the provider and imaging studies are consistent with end stage degenerative joint disease of the left knee(s) and total knee arthroplasty is deemed medically necessary. The treatment options including medical management, injection therapy arthroscopy and arthroplasty were discussed at length. The risks and benefits of total knee arthroplasty were presented and reviewed. The risks due to aseptic loosening, infection, stiffness, patella tracking problems, thromboembolic complications and other imponderables were discussed. The patient acknowledged the explanation, agreed to proceed with the plan and consent was signed. Patient is being admitted for inpatient treatment for surgery, pain control, PT, OT, prophylactic antibiotics, VTE prophylaxis, progressive ambulation and ADL's and discharge planning. The patient is planning to be discharged home with home health services

## 2013-11-25 MED ORDER — HYDROMORPHONE HCL 4 MG PO TABS: 2.0000 mg | ORAL_TABLET | ORAL | Status: DC | PRN

## 2013-11-25 NOTE — Progress Notes (Signed)
Utilization review completed.  

## 2013-11-25 NOTE — Progress Notes (Signed)
OT Cancellation Note  Patient Details Name: Farrel ConnersKatherine Brucker MRN: [id number] DOB: December 01, 1950   Cancelled Treatment:    Reason Eval/Treat Not Completed: Other (comment). In to speak to pt and she reports that she and her husband have a system worked out from when she had a uni-compartmental knee 3 years ago.Since that time her husband has had a stroke with expressive and receptive issues so I asked if she still thinks it will all work out with him assisting her given this and her new knee surgery and she reports--"yes". She has a tub seat at home and is not worried about using it again. She says she also a 64 yo nephew that lives with them and can help if needed as well. Acute OT will sign off.  Evette GeorgesLeonard, Brandyce Dimario Eva [phone] 11/25/2013, 1:15 PM

## 2013-11-25 NOTE — Plan of Care (Signed)
Problem: Consults Goal: Diagnosis- Total Joint Replacement Primary Total Knee     

## 2013-11-25 NOTE — Progress Notes (Signed)
Physical Therapy Treatment Patient Details Name: Cathy Liu MRN: [id number] DOB: 1950-09-15 Today's Date: 11/25/2013    History of Present Illness s/p Lt TKA     PT Comments    Pt slowly progressing with therapy. prefers knee flexion; lacks 5 degrees in extension. Will cont to follow and progress mobility and thera ex for strengthening and ROM.   Follow Up Recommendations  Home health PT;Supervision/Assistance - 24 hour     Equipment Recommendations  Rolling walker with 5" wheels;Standard walker    Recommendations for Other Services OT consult     Precautions / Restrictions Precautions Precautions: Knee;Fall Precaution Comments: reinforced no pillow under knee Restrictions Weight Bearing Restrictions: Yes LLE Weight Bearing: Weight bearing as tolerated    Mobility  Bed Mobility Overal bed mobility: Needs Assistance Bed Mobility: Sit to Supine;Supine to Sit     Supine to sit: HOB elevated;Supervision Sit to supine: Supervision   General bed mobility comments: cues for technique; incr time required due to pain; use of handrails and HOB elevated   Transfers Overall transfer level: Needs assistance Equipment used: Rolling walker (2 wheeled) Transfers: Sit to/from Stand Sit to Stand: Min guard         General transfer comment: min guard to steady; cues for hand placement and safety with RW  Ambulation/Gait Ambulation/Gait assistance: Min guard Ambulation Distance (Feet): 60 Feet Assistive device: Rolling walker (2 wheeled) Gait Pattern/deviations: Step-to pattern;Decreased stance time - left;Decreased step length - right;Antalgic;Trunk flexed Gait velocity: decreased due to pain  Gait velocity interpretation: Below normal speed for age/gender General Gait Details: cues for step through technique and upright posture; pt with antalgic gt and keeps lt LE flexed during ambulation    Stairs            Wheelchair Mobility    Modified Rankin (Stroke  Patients Only)       Balance Overall balance assessment: Needs assistance Sitting-balance support: Feet supported;No upper extremity supported Sitting balance-Leahy Scale: Good Sitting balance - Comments: sat EOB of exercises without UE support    Standing balance support: During functional activity;Bilateral upper extremity supported Standing balance-Leahy Scale: Fair Standing balance comment: able to stand minimal amount of time without UE support                    Cognition Arousal/Alertness: Awake/alert Behavior During Therapy: WFL for tasks assessed/performed Overall Cognitive Status: Within Functional Limits for tasks assessed                      Exercises Total Joint Exercises Ankle Circles/Pumps: AROM;Strengthening;Both;10 reps;Supine Quad Sets: AROM;Both;Left;Supine;10 reps Hip ABduction/ADduction: AAROM;Strengthening;Left;10 reps;Supine Long Arc Quad: AROM;Strengthening;Left;10 reps;Seated Knee Flexion: AAROM;Left;10 reps;Seated Goniometric ROM: lacking 5 degrees ext; AROM in sitting 80 degrees flexion     General Comments        Pertinent Vitals/Pain 8/10; pt premedicated by RN; positioned in footsie roll     Home Living                      Prior Function            PT Goals (current goals can now be found in the care plan section) Acute Rehab PT Goals Patient Stated Goal: to be independent again  PT Goal Formulation: With patient Time For Goal Achievement: 11/28/13 Potential to Achieve Goals: Good Progress towards PT goals: Progressing toward goals    Frequency  7X/week    PT Plan Current plan remains  appropriate    Co-evaluation             End of Session Equipment Utilized During Treatment: Gait belt Activity Tolerance: Patient tolerated treatment well Patient left: in bed;with call bell/phone within reach;with nursing/sitter in room;with family/visitor present     Time: 0924-0949 PT Time Calculation  (min): 25 min  Charges:  $Gait Training: 8-22 mins $Therapeutic Exercise: 8-22 mins                    G Codes:  Functional Assessment Tool Used: clinical judgement  Functional Limitation: Mobility: Walking and moving around Mobility: Walking and Moving Around Current Status [phone](G8978): At least 1 percent but less than 20 percent impaired, limited or restricted Mobility: Walking and Moving Around Goal Status [phone](G8979): At least 1 percent but less than 20 percent impaired, limited or restricted   Donell SievertWest, Cohen Doleman N , [place]  [phone]  11/25/2013, 11:43 AM

## 2013-11-25 NOTE — Progress Notes (Signed)
Physical Therapy Treatment Patient Details Name: Cathy Liu MRN: [id number] DOB: 1950-12-28 Today's Date: 11/25/2013    History of Present Illness s/p Lt TKA     PT Comments    Pt progressing slowly with therapy. Pt at min guard for mobility. Pt with flat affect this session and difficulty staying on task; pt reports this is secondary to pain medication. Will cont to follow per POC. Patient needs to practice stairs next session prior to D/C.     Follow Up Recommendations  Home health PT;Supervision/Assistance - 24 hour     Equipment Recommendations  Rolling walker with 5" wheels;Standard walker    Recommendations for Other Services OT consult     Precautions / Restrictions Precautions Precautions: Knee;Fall Precaution Booklet Issued: Yes (comment) Precaution Comments: reinforced no pillow under knee Restrictions Weight Bearing Restrictions: Yes LLE Weight Bearing: Weight bearing as tolerated    Mobility  Bed Mobility Overal bed mobility: Needs Assistance Bed Mobility: Supine to Sit;Sit to Supine     Supine to sit: HOB elevated;Supervision Sit to supine: Supervision   General bed mobility comments: incr time due to pain; use of handrails   Transfers Overall transfer level: Needs assistance Equipment used: Rolling walker (2 wheeled) Transfers: Sit to/from Stand Sit to Stand: Min guard         General transfer comment: min guard to steady; has incr difficulty achieving standing from lower surface toilet; required handicap handrail for (A)   Ambulation/Gait Ambulation/Gait assistance: Min guard Ambulation Distance (Feet): 200 Feet Assistive device: Rolling walker (2 wheeled) Gait Pattern/deviations: Step-through pattern;Decreased stance time - left;Decreased step length - right;Trunk flexed;Antalgic Gait velocity: decreased due to pain  Gait velocity interpretation: Below normal speed for age/gender General Gait Details: pt progressing to step through  gt; c/o pain and required 2 standing rest breaks; min guard to steady    Careers information officer    Modified Rankin (Stroke Patients Only)       Balance Overall balance assessment: Needs assistance Sitting-balance support: Feet supported;No upper extremity supported Sitting balance-Leahy Scale: Good Sitting balance - Comments: sat EOB of exercises without UE support    Standing balance support: During functional activity Standing balance-Leahy Scale: Fair Standing balance comment: pt able to stand at sink for ADLs; no LOB noted                     Cognition Arousal/Alertness: Awake/alert Behavior During Therapy: Flat affect (secondary to medications per pt) Overall Cognitive Status: Within Functional Limits for tasks assessed                      Exercises Total Joint Exercises Ankle Circles/Pumps: AROM;Strengthening;Both;10 reps;Supine Quad Sets: AROM;Both;Left;Supine;10 reps (cues to stay on task) Heel Slides: AAROM;Left;10 reps;Supine;Other (comment) (educated to use sheet for AAROM) Hip ABduction/ADduction: AAROM;Strengthening;Left;10 reps;Supine Long Arc Quad: AROM;Strengthening;Left;10 reps;Seated Knee Flexion: AAROM;Left;10 reps;Seated Goniometric ROM: lacking 5 degrees ext; AROM in sitting 80 degrees flexion     General Comments        Pertinent Vitals/Pain "12/10" pt premedicated by RN    Home Living                      Prior Function            PT Goals (current goals can now be found in the care plan section) Acute Rehab PT Goals Patient Stated Goal: to be independent  again  PT Goal Formulation: With patient Time For Goal Achievement: 11/28/13 Potential to Achieve Goals: Good Progress towards PT goals: Progressing toward goals    Frequency  7X/week    PT Plan Current plan remains appropriate    Co-evaluation             End of Session Equipment Utilized During Treatment: Gait  belt Activity Tolerance: Patient tolerated treatment well Patient left: in bed;with call bell/phone within reach;with nursing/sitter in room;with family/visitor present     Time: 1330-1356 PT Time Calculation (min): 26 min  Charges:  $Gait Training: 8-22 mins $Therapeutic Exercise: 8-22 mins                    G Codes:  Functional Assessment Tool Used: clinical judgement  Functional Limitation: Mobility: Walking and moving around Mobility: Walking and Moving Around Current Status ([id number](G8978): At least 1 percent but less than 20 percent impaired, limited or restricted Mobility: Walking and Moving Around Goal Status [phone](G8979): At least 1 percent but less than 20 percent impaired, limited or restricted   Donell SievertWest, Jovi Alvizo N, [place]  [phone] 11/25/2013, 2:04 PM

## 2013-11-25 NOTE — Progress Notes (Signed)
Patient ID: Cathy ConnersKatherine Germain, female   DOB: [birth year], 63 y.o.   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 Patient states that her leg is still numb from the block. Dressing is clean and dry. Plan for progressive ambulation weightbearing as tolerated. Anticipate discharge to home on Saturday.

## 2013-11-26 ENCOUNTER — Encounter (HOSPITAL_COMMUNITY): Payer: Self-pay | Admitting: Orthopedic Surgery

## 2013-11-26 MED ORDER — HYDROCORTISONE 1 % EX CREA
TOPICAL_CREAM | Freq: Two times a day (BID) | CUTANEOUS | Status: DC
  Administered 2013-11-26 – 2013-11-27 (×2): via TOPICAL
  Filled 2013-11-26: qty 28

## 2013-11-26 NOTE — Progress Notes (Signed)
Physical Therapy Treatment Patient Details Name: Cathy Liu MRN: [id number] DOB: [birth year] Today's Date: 11/26/2013    History of Present Illness s/p Lt TKA     PT Comments    Pt was agitated about doing therapy this session and was very limited by pain and fatigue. Helped pt to bathroom and to brush teeth at beginning of session. Pt was not able to amb very far this session and had no motivation to amb. Pt tolerated exercises well and was a little more motivated to do these. Pt concerned about itching on LLE from dressing and spotted a possible rash. RN made aware and came to check at end of session.   Follow Up Recommendations  Home health PT;Supervision/Assistance - 24 hour     Equipment Recommendations  Rolling walker with 5" wheels;Standard walker    Recommendations for Other Services       Precautions / Restrictions Precautions Precautions: Knee Restrictions LLE Weight Bearing: Weight bearing as tolerated    Mobility  Bed Mobility Overal bed mobility: Needs Assistance Bed Mobility: Supine to Sit;Sit to Supine     Supine to sit: Supervision Sit to supine: Min guard   General bed mobility comments: pt is able to use RLE well to get LLE onto bed. verball cues for hand placements when going to supine. use of handrails. min guard for safety.  Transfers Overall transfer level: Needs assistance Equipment used: Rolling walker (2 wheeled) Transfers: Sit to/from [employer] Sit to Stand: Min guard Stand pivot transfers: Min guard       General transfer comment: min guard to steady. cues for handplacement. stand pivot transfers from chair to bed at end of session.   Ambulation/Gait Ambulation/Gait assistance: Min guard Ambulation Distance (Feet): 50 Feet Assistive device: Rolling walker (2 wheeled) Gait Pattern/deviations: Step-to pattern;Decreased stride length;Antalgic;Narrow base of support Gait velocity: decreased due to pain  Gait  velocity interpretation: Below normal speed for age/gender General Gait Details: pt was very limited by pain and said she was "wiped out" and could not amb far. verbal cues for sequencing with RW.   Stairs            Wheelchair Mobility    Modified Rankin (Stroke Patients Only)       Balance                                    Cognition Arousal/Alertness: Awake/alert Behavior During Therapy: WFL for tasks assessed/performed;Agitated Overall Cognitive Status: Within Functional Limits for tasks assessed                      Exercises Total Joint Exercises Quad Sets: AROM;10 reps;Left;Seated Heel Slides: AAROM;10 reps;Seated Hip ABduction/ADduction: AAROM;Left;Seated;5 reps Straight Leg Raises: AAROM;Left;Seated;5 reps Long Arc Quad: AAROM;Left;Seated;10 reps    General Comments        Pertinent Vitals/Pain 8/10. Pt premedicated. Pt repositioned in bed per pt request for comfort and in footsie roll.     Home Living                      Prior Function            PT Goals (current goals can now be found in the care plan section) Progress towards PT goals: Progressing toward goals    Frequency  7X/week    PT Plan Current plan remains appropriate    Co-evaluation  End of Session Equipment Utilized During Treatment: Gait belt Activity Tolerance: Patient tolerated treatment well;Patient limited by fatigue;Patient limited by pain Patient left: in bed;with call bell/phone within reach;with family/visitor present;with nursing/sitter in room     Time: 1307-1400 PT Time Calculation (min): 53 min  Charges:  $Gait Training: 8-22 mins $Therapeutic Exercise: 23-37 mins $Therapeutic Activity: 8-22 mins                    G Codes:      BRASFIELD,Kimetha Trulson,SPTA 11/26/2013, 2:43 PM

## 2013-11-26 NOTE — Progress Notes (Signed)
Physical Therapy Treatment Patient Details Name: Cathy ConnersKatherine Liu MRN: [id number] DOB: July 11, 1950 Today's Date: 11/26/2013    History of Present Illness s/p Lt TKA     PT Comments    Pt was very limited by pain today and had slight dizziness during amb. Pt was able to perform exercises well but needed low reps and was too sore to do SLR. Planning to do stair training with pt next session. Pt planning to D/C tomorrow afternoon.   Follow Up Recommendations  Home health PT;Supervision/Assistance - 24 hour     Equipment Recommendations  Rolling walker with 5" wheels;Standard walker    Recommendations for Other Services       Precautions / Restrictions Precautions Precautions: Knee Restrictions LLE Weight Bearing: Weight bearing as tolerated    Mobility  Bed Mobility Overal bed mobility: Needs Assistance Bed Mobility: Supine to Sit;Sit to Supine     Supine to sit: HOB elevated;Supervision Sit to supine: Supervision   General bed mobility comments: use of hand rails and cues for hand and arm placements. supervision for safety. instructions on how to use RLE to assist LLE getting into bed.   Transfers Overall transfer level: Needs assistance Equipment used: Rolling walker (2 wheeled) Transfers: Sit to/from Stand Sit to Stand: Min guard         General transfer comment: min guard to steady. Helped pt to bathroom twice and said "sitting on lower toilet felt better than having it raised".   Ambulation/Gait Ambulation/Gait assistance: Min guard Ambulation Distance (Feet): 200 Feet Assistive device: Rolling walker (2 wheeled) Gait Pattern/deviations: Step-to pattern;Decreased stride length;Antalgic;Narrow base of support Gait velocity: decreased due to pain  Gait velocity interpretation: Below normal speed for age/gender General Gait Details: Pt had very slow step-to pattern due to pain. instructions for heel strike during gait. min guard for safety and to steady. Pt  required 2 sitting rest breaks.    Stairs            Wheelchair Mobility    Modified Rankin (Stroke Patients Only)       Balance                                    Cognition Arousal/Alertness: Awake/alert Behavior During Therapy: Flat affect;WFL for tasks assessed/performed Overall Cognitive Status: Within Functional Limits for tasks assessed                      Exercises Total Joint Exercises Ankle Circles/Pumps: AROM;10 reps;Both;Seated Quad Sets: AROM;Left;5 reps;Seated Heel Slides: AAROM;Left;Seated;5 reps Hip ABduction/ADduction: AAROM;Seated;Left;5 reps Long Arc Quad: AAROM;Left;Seated;5 reps    General Comments        Pertinent Vitals/Pain no apparent distress. Pt repositioned in bed with HOB elevated for comfort per pt request with footsie roll.      Home Living                      Prior Function            PT Goals (current goals can now be found in the care plan section) Progress towards PT goals: Progressing toward goals    Frequency  7X/week    PT Plan Current plan remains appropriate    Co-evaluation             End of Session Equipment Utilized During Treatment: Gait belt Activity Tolerance: Patient tolerated treatment well;Patient limited by pain  Patient left: in bed;with family/visitor present;with call bell/phone within reach     Time: 1610-9604 PT Time Calculation (min): 58 min  Charges:                       G Codes:      BRASFIELD,Abrham Maslowski,SPTA 11/26/2013, 9:50 AM

## 2013-11-26 NOTE — Progress Notes (Signed)
Seen and agreed 11/26/2013 Charrisse Masley Elizabeth PTA [phone] pager [phone] office    

## 2013-11-26 NOTE — Progress Notes (Signed)
Utilization review completed.  

## 2013-11-26 NOTE — Progress Notes (Signed)
Subjective: 2 Days Post-Op Procedure(s) (LRB): TOTAL KNEE ARTHROPLASTY (Left) Patient reports pain as moderate.  Walked short distance yesterday.  Pain improving.  oob several times for voiding.  No urgency or frequency. Using IS at bedside  Objective: Vital signs in last 24 hours: Temp:  [98.1 F (36.7 C)-98.5 F (36.9 C)] 98.1 F (36.7 C) (07/17 0610) Pulse Rate:  [78-88] 86 (07/17 0610) Resp:  [14-20] 16 (07/17 0610) BP: (102-109)/(45-51) 105/48 mmHg (07/17 0610) SpO2:  [97 %-99 %] 98 % (07/17 0610) Weight:  [60.782 kg (134 lb)] 60.782 kg (134 lb) (07/16 2142)  Intake/Output from previous day: 07/16 0701 - 07/17 0700 In: 580 [P.O.:580] Out: -  Intake/Output this shift: Total I/O In: 240 [P.O.:240] Out: -   No results found for this basename: HGB,  in the last 72 hours No results found for this basename: WBC, RBC, HCT, PLT,  in the last 72 hours No results found for this basename: NA, K, CL, CO2, BUN, CREATININE, GLUCOSE, CALCIUM,  in the last 72 hours No results found for this basename: LABPT, INR,  in the last 72 hours  Neurovascular intact Sensation intact distally Intact pulses distally Dorsiflexion/Plantar flexion intact Incision: dressing C/D/I  Assessment/Plan: 2 Days Post-Op Procedure(s) (LRB): TOTAL KNEE ARTHROPLASTY (Left) Advance diet Up with therapy D/C IV fluids Plan for discharge tomorrow  Roseland Braun M 11/26/2013, 11:25 AM

## 2013-11-26 NOTE — Care Management Note (Addendum)
CARE MANAGEMENT NOTE 11/26/2013  Patient:  Farrel ConnersSEAMON,Adely   Account Number:  [id number]  Date Initiated:  11/26/2013  Documentation initiated by:  Vance PeperBRADY,Ezana Hubbert  Subjective/Objective Assessment:   63 yr old female s/p left total knee arthroplasty.     Action/Plan:   Case manager spoke with patient concerning home health and DME needs at discharge. [facility]. Patient has rolling walker, doesnt want 3in1. Has lot of family support at disicharge. Start of Care 11/28/13.   Anticipated DC Date:  11/26/2013   Anticipated DC Plan:  HOME W HOME HEALTH SERVICES      DC Planning Services  CM consult      [facility]   Choice offered to / List presented to:  C-1 Patient   DME arranged  NA        HH arranged  HH-2 PT      [facility]  [facility]   Status of service:  Completed, signed off Medicare Important Message given?   (If response is "NO", the following Medicare IM given date fields will be blank) Date Medicare IM given:   Medicare IM given by:   Date Additional Medicare IM given:   Additional Medicare IM given by:    Discharge Disposition:  HOME W HOME HEALTH SERVICES  Per UR Regulation:  Reviewed for med. necessity/level of care/duration of stay

## 2013-11-27 DIAGNOSIS — M1710 Unilateral primary osteoarthritis, unspecified knee: Secondary | ICD-10-CM | POA: Diagnosis present

## 2013-11-27 DIAGNOSIS — M179 Osteoarthritis of knee, unspecified: Secondary | ICD-10-CM | POA: Diagnosis present

## 2013-11-27 MED ORDER — ASPIRIN 325 MG PO TBEC: 325.0000 mg | DELAYED_RELEASE_TABLET | Freq: Every day | ORAL | Status: DC

## 2013-11-27 NOTE — Progress Notes (Signed)
Seen and agreed 11/27/2013 Robinette, Julia Elizabeth PTA [phone] pager [phone] office    

## 2013-11-27 NOTE — Discharge Instructions (Signed)
Preparing for Knee Replacement Recovery from knee replacement surgery can be made easier and more comfortable by taking steps to be prepared before surgery. This includes:  Arranging for others to help you.  Preparing your home.  Making sure your body is prepared by doing a pre-operative exam and being as healthy as you can.  Doing exercises before your surgery as told by your caregiver. Also, you can ease any concerns about your financial responsibilities by calling your insurance company after you decide to have surgery.In addition to your surgery and hospital stay, you will want to ask about your coverage for medical equipment, rehab facilities, and home care. ARRANGING FOR HELP  You will be getting stronger and more mobile every day. However, in the first couple weeks after surgery, it is unlikely you will be able to do all your daily activities as easily as before your surgery.You will tire easily and still have limited movement in your leg.Follow these guidelines to best arrange for the help you may need after your surgery:  Arrange for someone to drive you home from the hospital.Your surgeon will be able to tell you how many days you can expect to be in the hospital.  Cancel all work, care-giving, and volunteer responsibilities for at least 4 to 6 weeks after your surgery.  If you live alone, arrange for someone to care for your home and pets for the first 4 to 6 weeks.  Select someone with whom you feel comfortable to be with you day and night for the first week.This person will help you with your exercises and personal care, like bathing and using the toilet.  Arrange for drivers to bring you to and from your doctor and therapy appointments, as well as to the grocery store and other places you need to go, for at least 4 to 6 weeks.  Select 2 or 3 rehab facilities where you would be comfortable recovering just in case you are not able to go directly home to recover. PREPARING  YOUR HOME  Remove all clutter from your floors.  To see if you will be able to move in these spaces with a wheeled walker, hold your hands out about 6 inches from your sides. Then walk from your bed to the bathroom. Walk from your resting spot to your kitchen and bathroom.If you do not hit anything with your hands, you probably have enough room.  Remove throw rugs.  Move the items you use often to shelves and drawers that are at countertop height. Move items in your bathroom, kitchen, and bedroom.  Prepare a few meals that you can freeze and reheat later.  Do not plan on recovering in bed. It is better for your health to sit upright.You may wish to use a recliner with a small table nearby.Keep the items you use most frequently on that table. These may include the TV remote, a cordless phone, a book or laptop computer, water glass, and any other items of your choice.  Consider adding grab bars in the shower and near the toilet.  While you are in the hospital, you will learn about equipment helpful for your recovery.Some of the equipment includes raised toilet seats, tub benches, and shower benches.Often, your hospital care team will help you decide what you need and can direct you about where to buy these items.You may not need all of these items, and they are not often returnable, so it is not recommended that you buy them before going to the hospital.  PREPARING YOUR BODY  Complete a pre-operative exam.This will ensure that your body is healthy enough to safely complete this surgery.Be certain to bring a complete list of all your medicines and supplements (herbs and vitamins). You may be advised to have additional tests to ensure your safety.  Complete elective dental care and routine cleanings before the surgery.Germs from anywhere in your body, including those in your mouth, can travel to your new joint and infect it. It is important not to have any dental work performed for at  least 3 months after your surgery.After surgery, be sure to tell your dentist about your joint replacement.  Maintain a healthy diet.Unless advised by your surgeon, do not drastically change your diet before your surgery.  Quit smoking.Get help from your caregiver if you need it.  The day before your surgery, follow your surgeon's directions for showering, eating and drinking.These directions are for your safety. EXERCISES Your caregiver may have you do the following exercises before your surgery. Be sure to follow the exercise program your caregiver prescribes for you. Doing the exercises on both sides will help prepare your "good" sideas well. While completing these exercises, remember:   Stretch as long as you can, up to 30 seconds, without pain developing.  You should only feel a gentle lengthening or release in the stretched tissue. Ankle Pumps 1. While sitting with your knees straight, draw the top of your feet upwards by flexing your ankles. 2. Then, reverse the motion, pointing your toes downward. 3. Repeat 10 to 20 times.Complete this exercise 1 to 2 times per day. Heel Slides 1. Lie on your back with both knees straight.(If this causes back discomfort, bend your opposite knee, placing your foot flat on the floor.) 2. Slowly slide your heel back toward your buttocks until you feel a gentle stretch in the front of your knee or thigh. 3. Slowly slide your heel back to the starting position. 4. Repeat 10 to 20 times.Complete this exercise 1 to 2 times per day. Quadriceps Sets 1. Lie on your back with your sore leg extended and your opposite knee bent. 2. Gradually tense the muscles in the front of your thigh.You should see either your kneecap slide up toward your hip or increased dimpling just above the knee.This motion will push the back of the knee down toward the floor. 3. Hold the muscle as tight as you can without increasing your pain for 10 seconds. 4. Relax the  muscles slowly and completely in between each repetition. 5. Repeat 10 to 20 times.Complete this exercise 1 to 2 times per day. Short Arc Kicks 1. Lie on your back.Place a 4 to 6 inch towel roll under your sore knee so that the knee slightly bends. 2. Raise only your lower leg by tightening the muscles in the front of your thigh.Do not allow your thigh to rise. 3. Hold this position for 5 seconds. 4. Repeat 10 to 20 times.Complete this exercise 1 to 2 times per day. Straight Leg Raises 1. Lie on your back with your sore leg extended and your opposite knee bent. 2. Tense the muscles in the front of your sore thigh.You should see either your kneecap slide up or increased dimpling just above the knee. Your thigh may even quiver. 3. Tighten these muscles even more and raise your leg 4 to 6 inches off the floor.Hold for 3 to 5 seconds. 4. Keeping these muscles tense, lower your leg. 5. Relax the muscles slowly and completely in between each  repetition. 6. Repeat 10 to 20 times.Complete this exercise 1 to 2 times per day. Arm Chair Push-ups 1. Find a firm, non-wheeled chair with solid armrests. 2. Sitting in the chair, extend your sore leg straight out in front of you. 3. Lift up your body weight, using your arms and opposite leg. 4. Slowly lower your body weight. 5. Repeat 10 to 20 times.Complete this exercise 1 to 2 times per day. Document Released: 08/03/2010 Document Revised: 07/22/2011 Document Reviewed: 08/03/2010 [facility] Patient Information 2015 [place], [place]. This information is not intended to replace advice given to you by your health care provider. Make sure you discuss any questions you have with your health care provider.

## 2013-11-27 NOTE — Progress Notes (Addendum)
Subjective: 3 Days Post-Op Procedure(s) (LRB): TOTAL KNEE ARTHROPLASTY (Left) Patient reports pain as 7 on 0-10 scale.    Objective: Vital signs in last 24 hours: Temp:  [97.3 F (36.3 C)-98.2 F (36.8 C)] 98.1 F (36.7 C) (07/18 0500) Pulse Rate:  [81-93] 83 (07/18 0500) Resp:  [16-18] 16 (07/18 0500) BP: (111-146)/(56-64) 111/58 mmHg (07/18 0500) SpO2:  [98 %-100 %] 98 % (07/18 0500)  Intake/Output from previous day: 07/17 0701 - 07/18 0700 In: 720 [P.O.:720] Out: -  Intake/Output this shift: Total I/O In: 240 [P.O.:240] Out: -   No results found for this basename: HGB,  in the last 72 hours No results found for this basename: WBC, RBC, HCT, PLT,  in the last 72 hours No results found for this basename: NA, K, CL, CO2, BUN, CREATININE, GLUCOSE, CALCIUM,  in the last 72 hours No results found for this basename: LABPT, INR,  in the last 72 hours  Neurologically intact inferior incision erythematous without drainage. ? From direct ice vs swelling . Will elevate and not apply direct ice. No fever.   Assessment/Plan: 3 Days Post-Op Procedure(s) (LRB): TOTAL KNEE ARTHROPLASTY (Left) Up with therapy to do stairs then discharge home. EC asa times 4 wks.   Dilaudid Rx on chart  Brock Larmon C 11/27/2013, 8:50 AM

## 2013-11-27 NOTE — Progress Notes (Signed)
Physical Therapy Treatment Patient Details Name: Cathy Liu MRN: [id number] DOB: 04-12-1951 Today's Date: 11/27/2013    History of Present Illness s/p Lt TKA     PT Comments    Patient progressing and able to complete stair training this morning. PLanning to DC home.   Follow Up Recommendations  Home health PT;Supervision/Assistance - 24 hour     Equipment Recommendations  Rolling walker with 5" wheels;Standard walker    Recommendations for Other Services       Precautions / Restrictions Precautions Precautions: Knee Restrictions LLE Weight Bearing: Weight bearing as tolerated    Mobility  Bed Mobility Overal bed mobility: Modified Independent                Transfers Overall transfer level: Modified independent                  Ambulation/Gait Ambulation/Gait assistance: Supervision Ambulation Distance (Feet): 60 Feet Assistive device: Rolling walker (2 wheeled) Gait Pattern/deviations: Step-to pattern;Decreased step length - right;Decreased step length - left     General Gait Details: Cues for posture   Stairs Stairs: Yes Stairs assistance: Min guard Stair Management: Step to pattern;Forwards;Two rails Number of Stairs: 5    Wheelchair Mobility    Modified Rankin (Stroke Patients Only)       Balance                                    Cognition Arousal/Alertness: Awake/alert Behavior During Therapy: WFL for tasks assessed/performed;Agitated Overall Cognitive Status: Within Functional Limits for tasks assessed                      Exercises      General Comments        Pertinent Vitals/Pain no apparent distress     Home Living                      Prior Function            PT Goals (current goals can now be found in the care plan section) Progress towards PT goals: Progressing toward goals    Frequency  7X/week    PT Plan Current plan remains appropriate     Co-evaluation             End of Session Equipment Utilized During Treatment: Gait belt Activity Tolerance: Patient tolerated treatment well Patient left: with call bell/phone within reach;in bed     Time: 0921-0940 PT Time Calculation (min): 19 min  Charges:  $Gait Training: 8-22 mins                    G Codes:      Fredrich BirksRobinette, Julia Elizabeth 11/27/2013, 12:27 PM 11/27/2013 Fredrich Birksobinette, Julia Elizabeth PTA [phone] pager [phone] office

## 2013-12-06 NOTE — Discharge Summary (Signed)
Physician Discharge Summary  Patient ID: Cathy Liu MRN: [id number] DOB/AGE: 1951-03-14 63 y.o.  Admit date: 11/24/2013 Discharge date: 12/06/2013  Admission Diagnoses: Osteoarthritis knee with retained unicompartmental arthroplasty  Discharge Diagnoses: Same Active Problems:   Total knee replacement status   Knee osteoarthritis   Discharged Condition: stable  Hospital Course: Patient's hospital course was essentially unremarkable. She underwent removal of the unicompartmental arthroplasty and placement of a total knee arthroplasty. Postoperatively patient progressed well with therapy and was discharged to home in stable condition.  Consults: None  Significant Diagnostic Studies: labs: Routine labs  Treatments: surgery: See operative note  Discharge Exam: Blood pressure 111/58, pulse 83, temperature 98.1 F (36.7 C), temperature source Oral, resp. rate 16, height 5' (1.524 m), weight 60.782 kg (134 lb), SpO2 98.00%. Incision/Wound: incision clean and dry  Disposition: 01-Home or Self Care  Discharge Instructions   Call MD / Call 911    Complete by:  As directed   If you experience chest pain or shortness of breath, CALL 911 and be transported to the hospital emergency room.  If you develope a fever above 101 F, pus (white drainage) or increased drainage or redness at the wound, or calf pain, call your surgeon's office.     Constipation Prevention    Complete by:  As directed   Drink plenty of fluids.  Prune juice may be helpful.  You may use a stool softener, such as Colace (over the counter) 100 mg twice a day.  Use MiraLax (over the counter) for constipation as needed.     Diet - low sodium heart healthy    Complete by:  As directed      Increase activity slowly as tolerated    Complete by:  As directed             Medication List         aspirin 325 MG EC tablet  Take 1 tablet (325 mg total) by mouth daily with breakfast.     diphenhydrAMINE 25 MG tablet   Commonly known as:  SOMINEX  Take 25 mg by mouth at bedtime as needed for sleep.     enalapril 5 MG tablet  Commonly known as:  VASOTEC  Take 5 mg by mouth daily.     HYDROmorphone 4 MG tablet  Commonly known as:  DILAUDID  Take 0.5 tablets (2 mg total) by mouth every 4 (four) hours as needed for moderate pain or severe pain.     multivitamin with minerals tablet  Take 1 tablet by mouth daily.     PROAIR HFA 108 (90 BASE) MCG/ACT inhaler  Generic drug:  albuterol  Inhale into the lungs every 6 (six) hours as needed for wheezing or shortness of breath.     sertraline 50 MG tablet  Commonly known as:  ZOLOFT  Take 50 mg by mouth at bedtime.     SUMAtriptan 50 MG tablet  Commonly known as:  IMITREX  Take 50 mg by mouth every 2 (two) hours as needed for migraine or headache. May repeat in 2 hours if headache persists or recurs.     topiramate 100 MG tablet  Commonly known as:  TOPAMAX  Take 100 mg by mouth at bedtime.     Vitamin D 2000 UNITS tablet  Take 2,000 Units by mouth daily.           Follow-up Information   Follow up with Bert Givans V, MD In 2 weeks.   Specialty:  Orthopedic Surgery  Contact information:   [address] [place] [address] [phone]       [facility]. (Someone from [facility] will contact you concerning start time for Physical therapy.)    Contact information:   [address] [place] [address] [phone]       Follow up with Min Tunnell V, MD In 1 week.   Specialty:  Orthopedic Surgery   Contact information:   [address] [place] [address] [phone]       Signed: Nadara MustardDUDA,Charnele Semple V 12/06/2013, 6:39 AM

## 2015-09-11 IMAGING — CR DG CHEST 2V
2 series · 2 of 2 positions shown · non-contrast
Comparison: None.

CLINICAL DATA: Preop osteoarthritis left knee

EXAM:
CHEST  2 VIEW

[w chest pa]
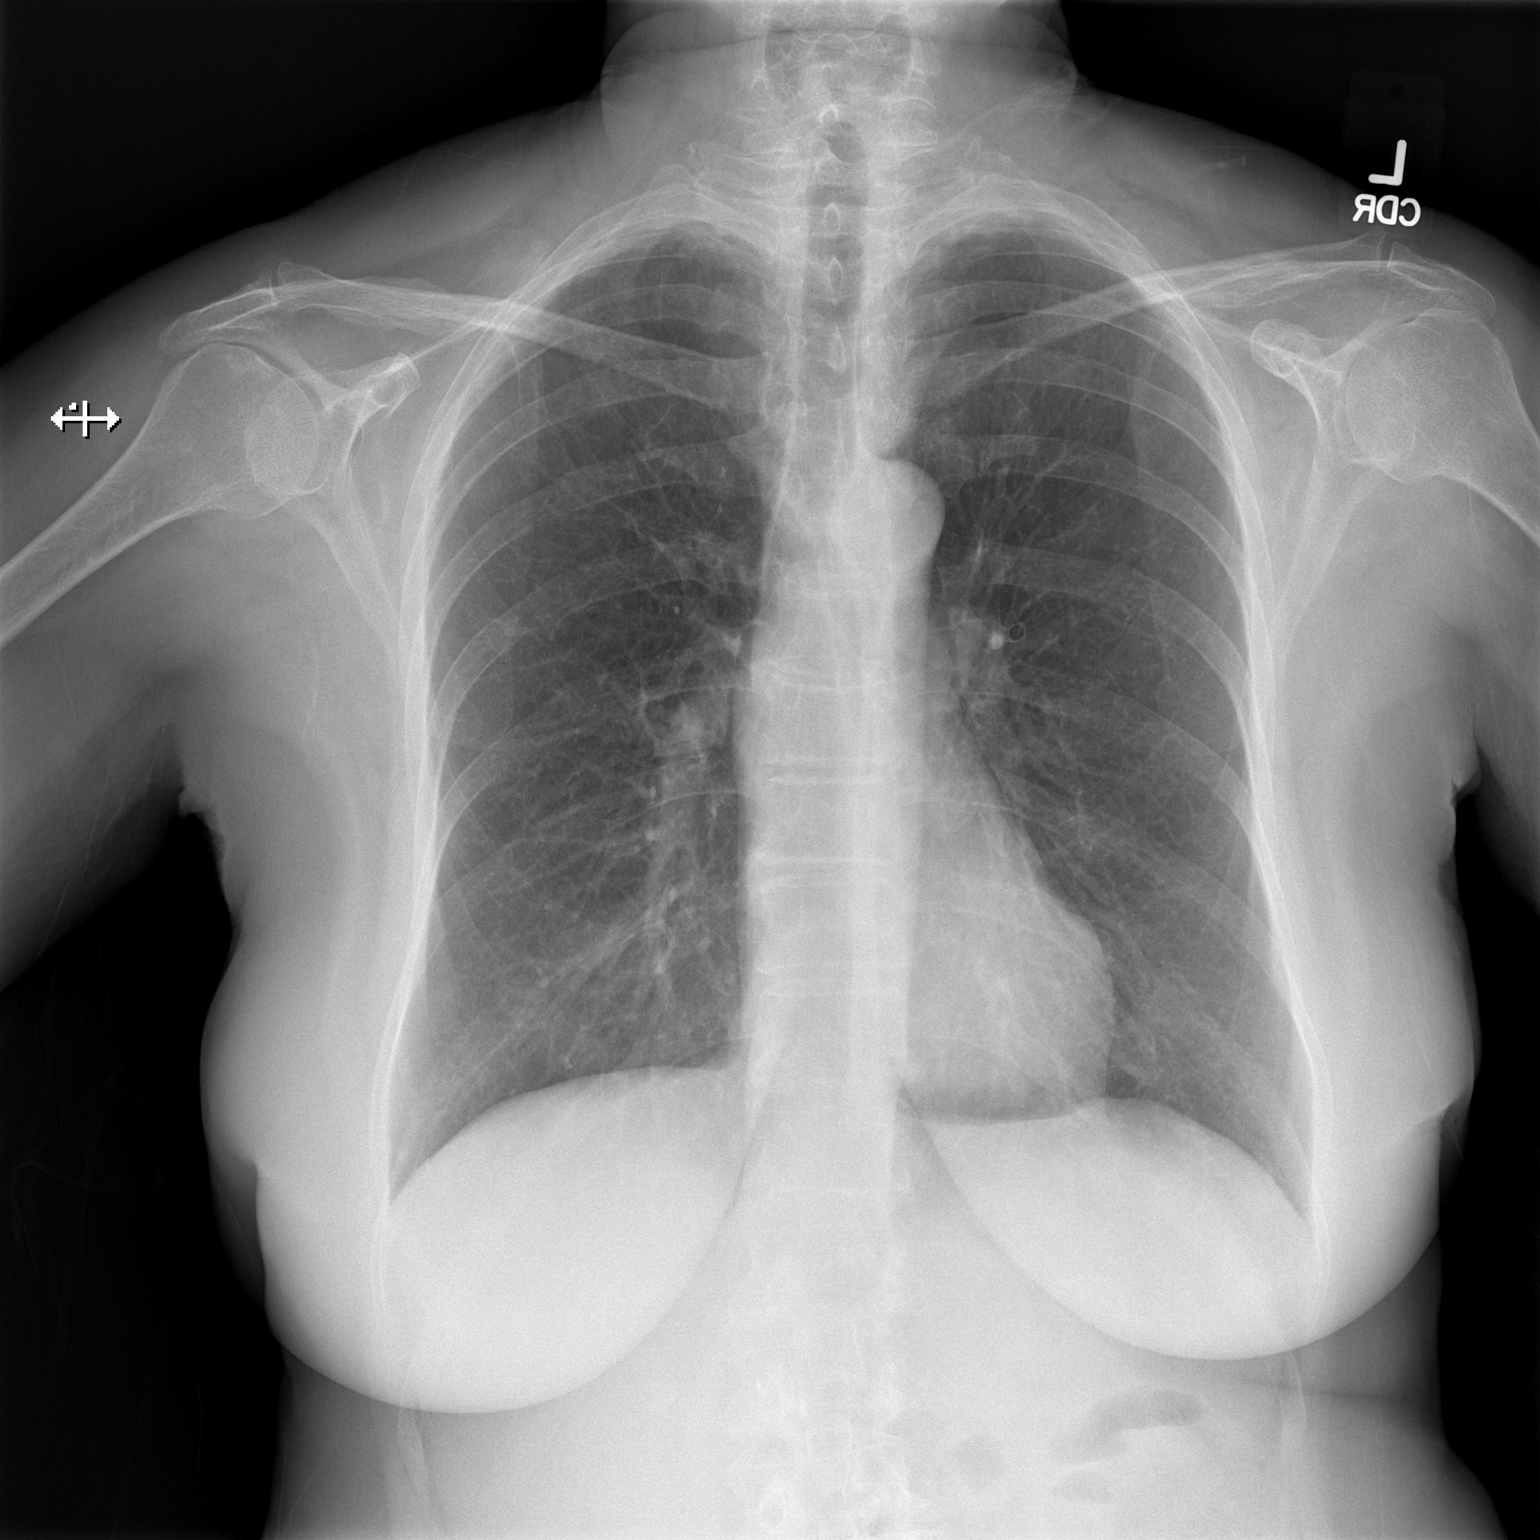

[w chest lat]
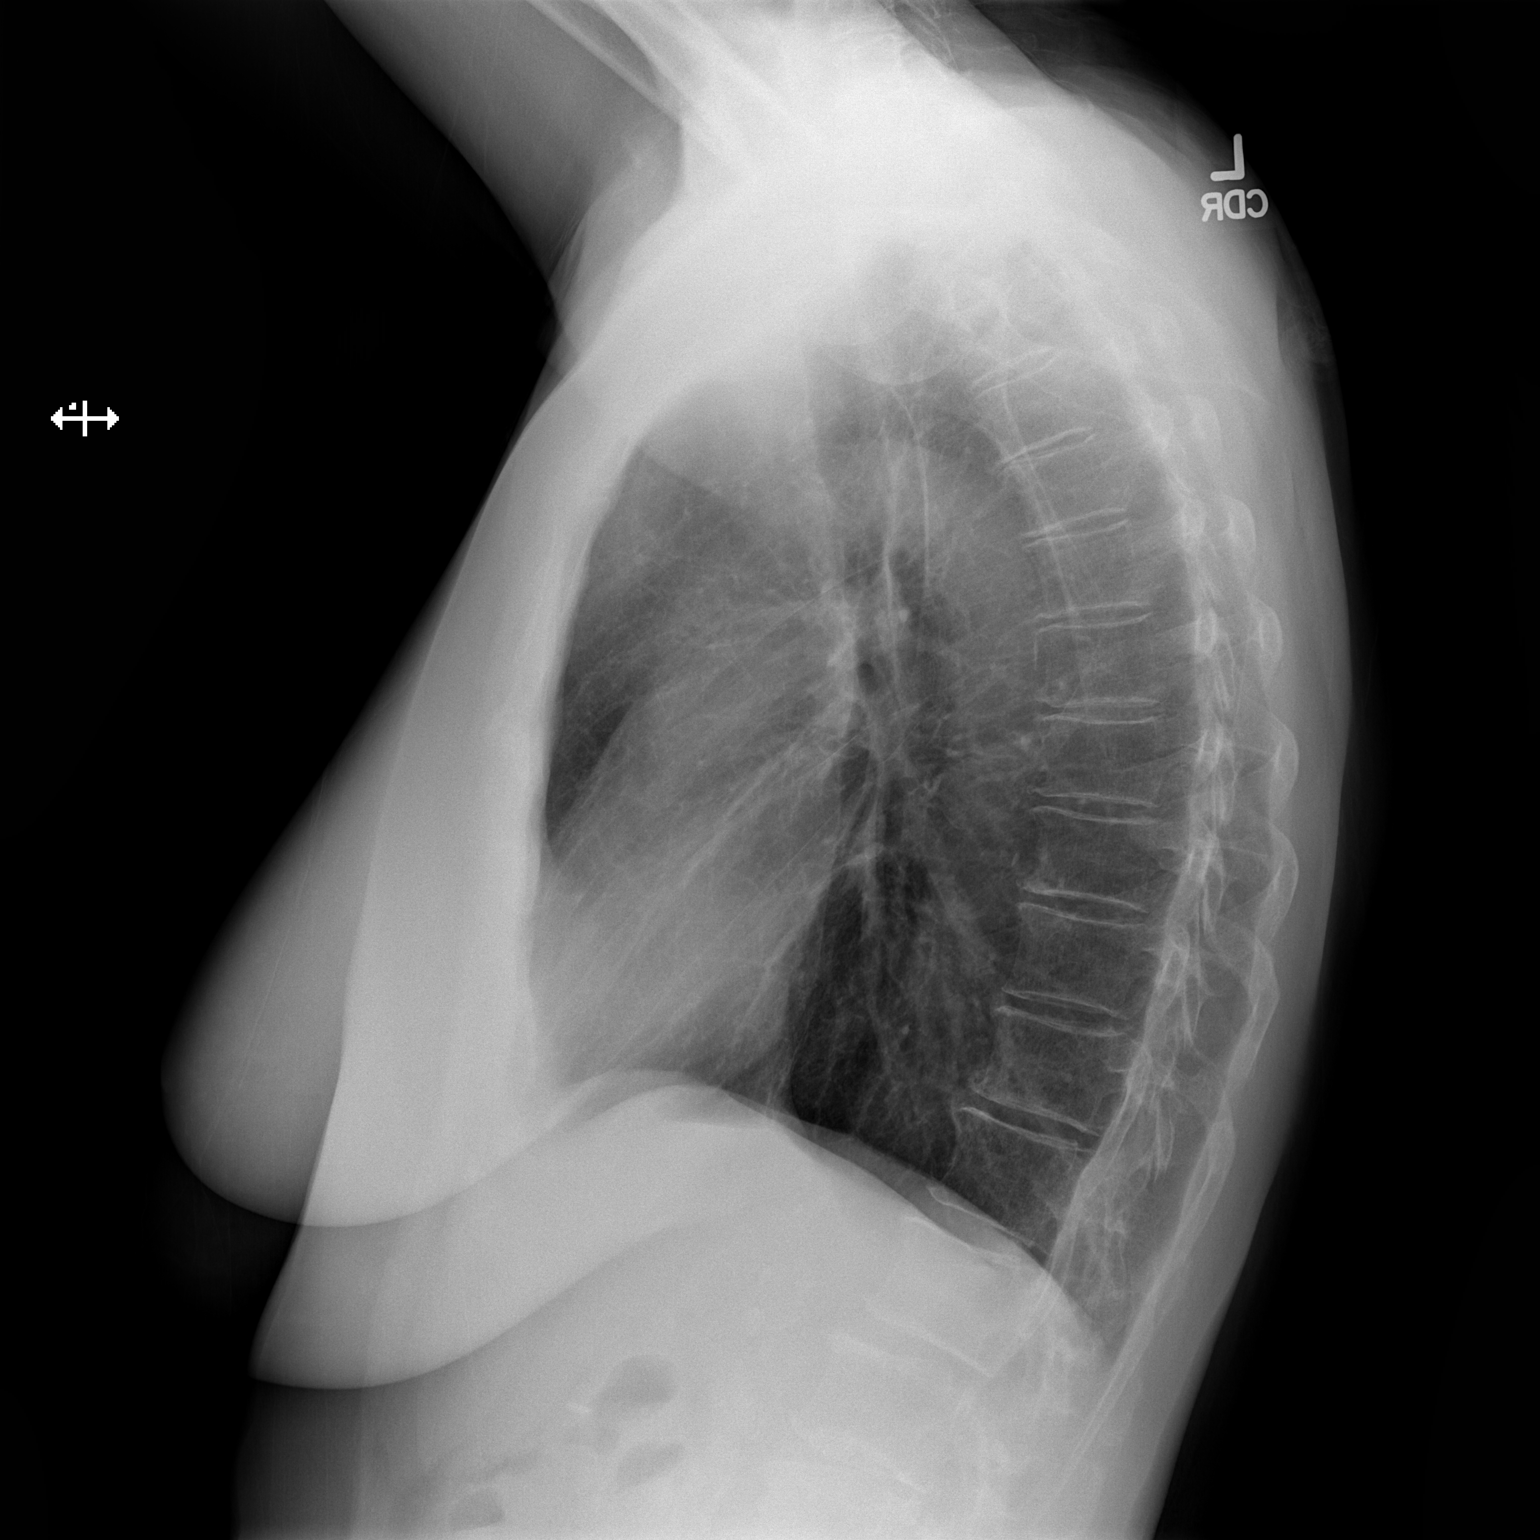

[2 of 2 positions shown; findings below may reference images not displayed]

FINDINGS: Heart size and vascularity are normal. Lungs are clear without
infiltrate effusion or mass. Mild apical scarring bilaterally.
IMPRESSION: No active cardiopulmonary disease.

## 2018-03-31 ENCOUNTER — Encounter (INDEPENDENT_AMBULATORY_CARE_PROVIDER_SITE_OTHER): Payer: Self-pay | Admitting: Orthopedic Surgery

## 2018-03-31 ENCOUNTER — Ambulatory Visit (INDEPENDENT_AMBULATORY_CARE_PROVIDER_SITE_OTHER): Payer: Medicare Other | Admitting: Orthopedic Surgery

## 2018-03-31 ENCOUNTER — Ambulatory Visit (INDEPENDENT_AMBULATORY_CARE_PROVIDER_SITE_OTHER): Payer: Self-pay

## 2018-03-31 VITALS — Ht 60.0 in | Wt 134.0 lb

## 2018-03-31 DIAGNOSIS — M205X1 Other deformities of toe(s) (acquired), right foot: Secondary | ICD-10-CM

## 2018-03-31 DIAGNOSIS — M21611 Bunion of right foot: Secondary | ICD-10-CM | POA: Diagnosis not present

## 2018-03-31 DIAGNOSIS — M79671 Pain in right foot: Secondary | ICD-10-CM

## 2018-04-01 ENCOUNTER — Encounter (INDEPENDENT_AMBULATORY_CARE_PROVIDER_SITE_OTHER): Payer: Self-pay | Admitting: Orthopedic Surgery

## 2018-04-01 NOTE — Progress Notes (Signed)
Office Visit Note   Patient: Cathy Liu           Date of Birth: 1951/05/02           MRN: [id number] Visit Date: 03/31/2018              Requested by: Jon Gillsassidy-Vu, Lisa, MD [address] [place], [place] [address] PCP: Jon Gillsassidy-Vu, Lisa, MD  Chief Complaint  Patient presents with  . Right Foot - Pain      HPI: Patient is a 67 year old woman who presents with increasing bunion deformities of both feet with increasing pain she has tried wider shoe wear without relief with the overlapping of the toes she states that she is almost developing ulcers on the great toe and second toe.  Assessment & Plan: Visit Diagnoses:  1. Pain in right foot   2. Bunion of great toe of right foot   3. Claw toe, acquired, right     Plan: Due to failure conservative care patient states she would like to proceed with surgical intervention.  We will plan for a proximal and distal osteotomy of the first metatarsal a Weil osteotomy for the second metatarsal plan [facility].  We will set this up as per her recommendation.  Follow-Up Instructions: Return in about 1 week (around 04/07/2018).   Ortho Exam  Patient is alert, oriented, no adenopathy, well-dressed, normal affect, normal respiratory effort. Examination patient has a normal gait she has good dorsalis pedis pulses she has good dorsiflexion of the ankle and subtalar joint.  She has overlapping of the great toe on the second toe with severe hallux valgus deformity worse on the right than the left.  She has developed no ulcers.  There is no pain with range of motion of the MTP joint.  She does have tenderness to palpation beneath the second metatarsal head on the right.  She is developing some skin discoloration where the toes are overlapping.  Imaging: No results found. No images are attached to the encounter.  Labs: No results found for: HGBA1C, ESRSEDRATE, CRP, LABURIC, REPTSTATUS, GRAMSTAIN, CULT, LABORGA   Lab  Results  Component Value Date   ALBUMIN 4.4 11/11/2013    Body mass index is 26.17 kg/m.  Orders:  Orders Placed This Encounter  Procedures  . XR Foot 2 Views Right   No orders of the defined types were placed in this encounter.    Procedures: No procedures performed  Clinical Data: No additional findings.  ROS:  All other systems negative, except as noted in the HPI. Review of Systems  Objective: Vital Signs: Ht 5' (1.524 m)   Wt 134 lb (60.8 kg)   BMI 26.17 kg/m   Specialty Comments:  No specialty comments available.  PMFS History: Patient Active Problem List   Diagnosis Date Noted  . Knee osteoarthritis 11/27/2013  . 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 Arthritis    back and knees  . Asthma    exercise induced;Albuterol inhaler prn  . Depression    takes Zoloft daily  . Headache(784.0) last one many yrs ago   takes Topamax nightly and Imitrex daily  . History of bronchitis 2010  . History of panic attacks several yrs ago  . Hyperlipidemia    borderline but no meds required  . Hypertension    takes Enalapril daily  . Joint pain   . Joint swelling left knee  . Lordosis   .  Osteoarthritis   . Scoliosis   . Shortness of breath    with exertion  . Urinary frequency     History reviewed. No pertinent family history.  Past Surgical History:  Procedure Laterality Date  . ADENOIDECTOMY  as a child  . CHOLECYSTECTOMY    . DILATION AND CURETTAGE OF UTERUS    . JOINT REPLACEMENT Left   . KNEE ARTHROSCOPY Bilateral   . RHINOPLASTY  as a child  . thyroid cyst drained  2006  . TONSILLECTOMY  as a child  . TOTAL KNEE ARTHROPLASTY Left 11/24/2013   DR DUDA  . TOTAL KNEE ARTHROPLASTY Left 11/24/2013   Procedure: TOTAL KNEE ARTHROPLASTY;  Surgeon: Nadara Mustard, MD;  Location: [facility] OR;  Service: Orthopedics;  Laterality: Left;  Left Total Knee Arthroplasty, Removal Uni knee  . VAGINAL HYSTERECTOMY     with  tubes and ovaries   Social History   Occupational History  . Not on file  Tobacco Use  . Smoking status: Never Smoker  . Smokeless tobacco: Never Used  Substance and Sexual Activity  . Alcohol use: No  . Drug use: No  . Sexual activity: Not Currently    Birth control/protection: Surgical

## 2018-04-24 ENCOUNTER — Ambulatory Visit (INDEPENDENT_AMBULATORY_CARE_PROVIDER_SITE_OTHER): Payer: Self-pay | Admitting: Physician Assistant

## 2018-05-12 NOTE — Pre-Procedure Instructions (Signed)
Cathy ConnersKatherine Liu  05/12/2018      [facility]- [place], - [place], [place] - [address] [place] [address] Phone: [phone] Fax: [phone]    Your procedure is scheduled on Wed., Jan. 8, 2020 from 10:39AM-11:24AM  [facility] "A" at 8:35AM  Call this number if you have problems the morning of surgery:  [phone]   Remember:  Do not eat or drink after midnight on Jan. 7th    Take these medicines the morning of surgery with A SIP OF WATER: Sertraline (ZOLOFT) and Topiramate (TOPAMAX)  If needed: Albuterol Inhaler and Budesonide-formoterol (SYMBICORT)- bring inhalers with you the day of surgery  As of today, stop taking all Other Aspirin Products, Vitamins, Fish oils, and Herbal medications. Also stop all NSAIDS i.e. Advil, Ibuprofen, Motrin, Aleve, Anaprox, Naproxen, BC, Goody Powders, and all Supplements.     Do not wear jewelry, make-up or nail polish.  Do not wear lotions, powders, or perfumes, or deodorant.  Do not shave 48 hours prior to surgery.    Do not bring valuables to the hospital.  [facility] is not responsible for any belongings or valuables.  Contacts, dentures or bridgework may not be worn into surgery.  Leave your suitcase in the car.  After surgery it may be brought to your room.  For patients admitted to the hospital, discharge time will be determined by your treatment team.  Patients discharged the day of surgery will not be allowed to drive home.   Special instructions:   [facility]- Preparing For Surgery  Before surgery, you can play an important role. Because skin is not sterile, your skin needs to be as free of germs as possible. You can reduce the number of germs on your skin by washing with CHG (chlorahexidine gluconate) Soap before surgery.  CHG is an antiseptic cleaner which kills germs and bonds with the skin to continue killing germs even after  washing.    Oral Hygiene is also important to reduce your risk of infection.  Remember - BRUSH YOUR TEETH THE MORNING OF SURGERY WITH YOUR REGULAR TOOTHPASTE  Please do not use if you have an allergy to CHG or antibacterial soaps. If your skin becomes reddened/irritated stop using the CHG.  Do not shave (including legs and underarms) for at least 48 hours prior to first CHG shower. It is OK to shave your face.  Please follow these instructions carefully.   1. Shower the NIGHT BEFORE SURGERY and the MORNING OF SURGERY with CHG.   2. If you chose to wash your hair, wash your hair first as usual with your normal shampoo.  3. After you shampoo, rinse your hair and body thoroughly to remove the shampoo.  4. Use CHG as you would any other liquid soap. You can apply CHG directly to the skin and wash gently with a scrungie or a clean washcloth.   5. Apply the CHG Soap to your body ONLY FROM THE NECK DOWN.  Do not use on open wounds or open sores. Avoid contact with your eyes, ears, mouth and genitals (private parts). Wash Face and genitals (private parts)  with your normal soap.  6. Wash thoroughly, paying special attention to the area where your surgery will be performed.  7. Thoroughly rinse your body with warm water from the neck down.  8. DO NOT shower/wash with your normal soap after using and rinsing off the CHG Soap.  9. Dennie BiblePat  yourself dry with a CLEAN TOWEL.  10. Wear CLEAN PAJAMAS to bed the night before surgery, wear comfortable clothes the morning of surgery  11. Place CLEAN SHEETS on your bed the night of your first shower and DO NOT SLEEP WITH PETS.  Day of Surgery:  Do not apply any deodorants/lotions.  Please wear clean clothes to the hospital/surgery center.   Remember to brush your teeth WITH YOUR REGULAR TOOTHPASTE.  Please read over the following fact sheets that you were given. Pain Booklet, Coughing and Deep Breathing and Surgical Site Infection Prevention

## 2018-05-14 ENCOUNTER — Encounter (HOSPITAL_COMMUNITY): Payer: Self-pay

## 2018-05-14 ENCOUNTER — Other Ambulatory Visit: Payer: Self-pay

## 2018-05-14 ENCOUNTER — Encounter (HOSPITAL_COMMUNITY)
Admission: RE | Admit: 2018-05-14 | Discharge: 2018-05-14 | Disposition: A | Discharge status: Home / Self Care | Payer: Medicare Other | Source: Ambulatory Visit | Attending: Orthopedic Surgery | Admitting: Orthopedic Surgery

## 2018-05-14 DIAGNOSIS — Z01818 Encounter for other preprocedural examination: Secondary | ICD-10-CM | POA: Insufficient documentation

## 2018-05-14 LAB — BASIC METABOLIC PANEL
Anion gap: 8 (ref 5–15)
BUN: 16 mg/dL (ref 8–23)
CALCIUM: 9 mg/dL (ref 8.9–10.3)
CO2: 23 mmol/L (ref 22–32)
Chloride: 108 mmol/L (ref 98–111)
Creatinine, Ser: 0.98 mg/dL (ref 0.44–1.00)
GFR calc Af Amer: >60 mL/min (ref >60)
GFR calc non Af Amer: 59 mL/min — ABNORMAL LOW (ref >60)
Glucose, Bld: 124 mg/dL — ABNORMAL HIGH (ref 70–99)
Potassium: 3.2 mmol/L — ABNORMAL LOW (ref 3.5–5.1)
Sodium: 139 mmol/L (ref 135–145)

## 2018-05-14 LAB — CBC
HCT: 39.3 % (ref 36.0–46.0)
Hemoglobin: 12.6 g/dL (ref 12.0–15.0)
MCH: 31.4 pg (ref 26.0–34.0)
MCHC: 32.1 g/dL (ref 30.0–36.0)
MCV: 98 fL (ref 80.0–100.0)
Platelets: 160 10*3/uL (ref 150–400)
RBC: 4.01 MIL/uL (ref 3.87–5.11)
RDW: 12.1 % (ref 11.5–15.5)
WBC: 6.5 10*3/uL (ref 4.0–10.5)
nRBC: 0 % (ref 0.0–0.2)

## 2018-05-14 NOTE — Progress Notes (Signed)
PCP - Dr. Misty Stanley Cassidy-Vu Cardiologist - Dr. Dot Been  Chest x-ray - n/a EKG - 05/14/2018 Stress Test - 05/2015 ECHO - 01/2016 - requested records Cardiac Cath - 09/25/2015  Sleep Study - n/a CPAP -   Fasting Blood Sugar - n/a Checks Blood Sugar _____ times a day  Blood Thinner Instructions: Aspirin Instructions:  Anesthesia review: yes, cardiac records requested.  Patient stated she had a cardiac workup done in 2017 as a reaction to meloxicam, but no other cardiac history.  Patient denies shortness of breath, fever, cough and chest pain at PAT appointment   Patient verbalized understanding of instructions that were given to them at the PAT appointment. Patient was also instructed that they will need to review over the PAT instructions again at home before surgery.

## 2018-05-19 NOTE — Anesthesia Preprocedure Evaluation (Addendum)
Anesthesia Evaluation  Patient identified by MRN, date of birth, ID band Patient awake    Reviewed: Allergy & Precautions, NPO status , Patient's Chart, lab work & pertinent test results  History of Anesthesia Complications Negative for: history of anesthetic complications  Airway Mallampati: II  TM Distance: >3 FB Neck ROM: Full    Dental  (+) Dental Advisory Given, Chipped,    Pulmonary asthma ,    breath sounds clear to auscultation       Cardiovascular hypertension, Pt. on medications  Rhythm:Regular Rate:Normal   Hx Takatsubo cardiomyopathy in 2017  '17 Cath (care everywhere): 1. Very mild non-obstructive CAD 2. Apical wall hypokinesis, LVEF 40% Diagnostic Procedure Recommendations Finding consistent with Takotsubo cardiomyopathy Medical treatment.  '17 Nuclear stress (care everywhere): 1. No reversible ischemia or infarction. 2. Normal left ventricular wall motion. 3. Left ventricular ejection fraction 94% (presumed typo) 4. Low-risk stress test findings*.   Neuro/Psych  Headaches, PSYCHIATRIC DISORDERS Anxiety Depression    GI/Hepatic negative GI ROS, Neg liver ROS,   Endo/Other  negative endocrine ROS  Renal/GU negative Renal ROS     Musculoskeletal  (+) Arthritis ,  CRPS Scoliosis    Abdominal   Peds  Hematology negative hematology ROS (+)   Anesthesia Other Findings On buprenorphine patch  Reproductive/Obstetrics                          Anesthesia Physical Anesthesia Plan  ASA: III  Anesthesia Plan: Regional   Post-op Pain Management:  Regional for Post-op pain   Induction: Intravenous  PONV Risk Score and Plan: 3 and Treatment may vary due to age or medical condition, Ondansetron and Propofol infusion  Airway Management Planned: Natural Airway and Simple Face Mask  Additional Equipment: None  Intra-op Plan:   Post-operative Plan:   Informed Consent: I have  reviewed the patients History and Physical, chart, labs and discussed the procedure including the risks, benefits and alternatives for the proposed anesthesia with the patient or authorized representative who has indicated his/her understanding and acceptance.   Dental advisory given  Plan Discussed with: CRNA and Anesthesiologist  Anesthesia Plan Comments: (Per patient request, will perform procedure under regional anesthetic with MAC)      Anesthesia Quick Evaluation

## 2018-05-19 NOTE — Progress Notes (Addendum)
Anesthesia Chart Review:  Case:  [id number] Date/Time:  05/20/18 1024   Procedure:  DOUBLE OSTEOTOMY RIGHT 1ST METATARSAL, WEIL OSTEOTOMY RIGHT 2ND METATARSAL (Right )   Anesthesia type:  General   Pre-op diagnosis:  Bunion and Claw Toe Right Foot   [facility]  [facility] 03 / [facility]   Surgeon:  Nadara Mustard, MD      DISCUSSION: 68 yo female never smoker. Pertinent hx includes HTN, DOE, Asthma, CRPS s/p L TKA x 2.  Pt recently started on Butrans patch which has helped her pain significantly. She changes patch on Wednesday. Will wait until after surgery to apply new patch  Review of records in care everywhere shows that in 2017 she had an episode of Takatsubo cardiomyopathy. She presented to the ED with chest pain and ultimately underwent LHC showing "Very mild non-obstructive CAD. Apical wall hypokinesis, LVEF 40%. Finding consistent with Takotsubo cardiomyopathy. Recommend medical treatment."  She subsequently participated in cardiac rehab and continued to follow with Dr. Cathlean Cower. Last OV was 02/26/16 and per his note he stated the pt was "Doing well from a cardiac standpoint. No current symptoms and blood pressure stable. Recent cardiac tests reviewed and summarized. Continue Current medical therapy except DC metoprolol. Exercise recommended."  She had a repeat Echo 01/26/2016. Unfortunately, the results are not available in Epic. The study was done at [facility], which is now [facility]. I have requested records from both orgs, as well as from pt's PCP via fax and phone with no response.   I called the pt to clarify her cardiac history. She says that on her last visit to Dr. Dot Been she recalls being told that her Echo had normalized and she no longer needed to followup. Since that time she denies any CP or SOB. 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  Discussed case with Dr. Maple Hudson as we do not have pt's most recent echo. He advised that as  long as she has remained asymptomatic from a CV standpoint then we can proceed as planned.  VS: BP (!) 141/68   Pulse 84   Temp 36.6 C   Resp 20   Ht 5' (1.524 m)   Wt 52.3 kg   SpO2 100%   BMI 22.50 kg/m   PROVIDERS: Jon Gills, MD is PCP  Cathlean Cower, MD is Cardiologist  LABS: Labs reviewed: Acceptable for surgery. (all labs ordered are listed, but only abnormal results are displayed)  Labs Reviewed  BASIC METABOLIC PANEL - Abnormal; Notable for the following components:      Result Value   Potassium 3.2 (*)    Glucose, Bld 124 (*)    GFR calc non Af Amer 59 (*)    All other components within normal limits  CBC     EKG: 05/14/18: Normal sinus rhythm. Rate 89. Low voltage QRS.  CV: Cath 09/25/2015 (care everywhere): Summary: 1. Very mild non-obstructive CAD 2. Apical wall hypokinesis, LVEF 40% Diagnostic Procedure Recommendations Finding consistent with Takotsubo cardiomyopathy Medical treatment.  Nuclear stress 05/31/15 (care everywhere): IMPRESSION: 1. No reversible ischemia or infarction.  2. Normal left ventricular wall motion.  3. Left ventricular ejection fraction 94%  4. Low-risk stress test findings*.  Past Medical History:  Diagnosis Date  . Arthritis    back and knees  . Asthma    exercise  and cold weather induced;Albuterol inhaler prn  . Depression  takes Zoloft daily  . Headache(784.0) last one many yrs ago   takes Topamax nightly and Imitrex daily  . History of bronchitis 2010  . History of panic attacks several yrs ago  . Hyperlipidemia    borderline but no meds required  . Hypertension    takes Enalapril daily  . Joint pain   . Joint swelling left knee  . Lordosis   . Osteoarthritis   . Scoliosis   . Shortness of breath    with exertion and very cold weather  . Urinary frequency     Past Surgical History:  Procedure Laterality Date  . ADENOIDECTOMY  as a child  . CHOLECYSTECTOMY    . DILATION AND CURETTAGE OF  UTERUS    . JOINT REPLACEMENT Left   . KNEE ARTHROSCOPY Bilateral   . RHINOPLASTY  as a child  . thyroid cyst drained  2006  . TONSILLECTOMY  as a child  . TOTAL KNEE ARTHROPLASTY Left 11/24/2013   DR DUDA  . TOTAL KNEE ARTHROPLASTY Left 11/24/2013   Procedure: TOTAL KNEE ARTHROPLASTY;  Surgeon: Nadara MustardMarcus Duda V, MD;  Location: [facility] OR;  Service: Orthopedics;  Laterality: Left;  Left Total Knee Arthroplasty, Removal Uni knee  . VAGINAL HYSTERECTOMY     with tubes and ovaries    MEDICATIONS: . albuterol (PROAIR HFA) 108 (90 BASE) MCG/ACT inhaler  . aspirin EC 325 MG EC tablet  . budesonide-formoterol (SYMBICORT) 160-4.5 MCG/ACT inhaler  . buprenorphine (BUTRANS - DOSED MCG/HR) 20 MCG/HR PTWK patch  . enalapril (VASOTEC) 5 MG tablet  . HYDROmorphone (DILAUDID) 4 MG tablet  . sertraline (ZOLOFT) 50 MG tablet  . SUMAtriptan (IMITREX) 50 MG tablet  . topiramate (TOPAMAX) 100 MG tablet   No current facility-administered medications for this encounter.     Zannie CoveJames Burns, PA-C [facility] Phone [phone] 05/19/2018 12:56 PM

## 2018-05-20 ENCOUNTER — Encounter (HOSPITAL_COMMUNITY): Payer: Self-pay

## 2018-05-20 ENCOUNTER — Ambulatory Visit (HOSPITAL_COMMUNITY)
Admission: RE | Admit: 2018-05-20 | Discharge: 2018-05-20 | Disposition: A | Discharge status: Home / Self Care | Payer: Medicare Other | Attending: Orthopedic Surgery | Admitting: Orthopedic Surgery

## 2018-05-20 ENCOUNTER — Encounter (HOSPITAL_COMMUNITY)
Admission: RE | Disposition: A | Discharge status: Home / Self Care | Payer: Self-pay | Source: Home / Self Care | Attending: Orthopedic Surgery

## 2018-05-20 ENCOUNTER — Ambulatory Visit (HOSPITAL_COMMUNITY): Payer: Medicare Other | Admitting: Certified Registered Nurse Anesthetist

## 2018-05-20 ENCOUNTER — Other Ambulatory Visit: Payer: Self-pay

## 2018-05-20 ENCOUNTER — Ambulatory Visit (HOSPITAL_COMMUNITY): Payer: Medicare Other | Admitting: Physician Assistant

## 2018-05-20 DIAGNOSIS — Z888 Allergy status to other drugs, medicaments and biological substances status: Secondary | ICD-10-CM | POA: Diagnosis not present

## 2018-05-20 DIAGNOSIS — J45909 Unspecified asthma, uncomplicated: Secondary | ICD-10-CM | POA: Insufficient documentation

## 2018-05-20 DIAGNOSIS — Q6689 Other  specified congenital deformities of feet: Secondary | ICD-10-CM | POA: Insufficient documentation

## 2018-05-20 DIAGNOSIS — R51 Headache: Secondary | ICD-10-CM | POA: Diagnosis not present

## 2018-05-20 DIAGNOSIS — M205X1 Other deformities of toe(s) (acquired), right foot: Secondary | ICD-10-CM | POA: Diagnosis not present

## 2018-05-20 DIAGNOSIS — Z885 Allergy status to narcotic agent status: Secondary | ICD-10-CM | POA: Insufficient documentation

## 2018-05-20 DIAGNOSIS — M21611 Bunion of right foot: Secondary | ICD-10-CM

## 2018-05-20 DIAGNOSIS — Z96652 Presence of left artificial knee joint: Secondary | ICD-10-CM | POA: Diagnosis not present

## 2018-05-20 DIAGNOSIS — I10 Essential (primary) hypertension: Secondary | ICD-10-CM | POA: Insufficient documentation

## 2018-05-20 DIAGNOSIS — Z79899 Other long term (current) drug therapy: Secondary | ICD-10-CM | POA: Insufficient documentation

## 2018-05-20 HISTORY — PX: WEIL OSTEOTOMY: SHX5044

## 2018-05-20 SURGERY — OSTEOTOMY, WEIL
Anesthesia: Monitor Anesthesia Care | Laterality: Right

## 2018-05-20 MED ORDER — FENTANYL CITRATE (PF) 250 MCG/5ML IJ SOLN
INTRAMUSCULAR | Status: DC | PRN
  Administered 2018-05-20 (×2): 50 ug via INTRAVENOUS

## 2018-05-20 MED ORDER — GLYCOPYRROLATE PF 0.2 MG/ML IJ SOSY
PREFILLED_SYRINGE | INTRAMUSCULAR | Status: AC
  Filled 2018-05-20: qty 1

## 2018-05-20 MED ORDER — CEFAZOLIN SODIUM-DEXTROSE 2-4 GM/100ML-% IV SOLN
2.0000 g | INTRAVENOUS | Status: AC
  Administered 2018-05-20: 2 g via INTRAVENOUS

## 2018-05-20 MED ORDER — ALBUTEROL SULFATE HFA 108 (90 BASE) MCG/ACT IN AERS
INHALATION_SPRAY | RESPIRATORY_TRACT | Status: AC
  Filled 2018-05-20: qty 6.7

## 2018-05-20 MED ORDER — PROPOFOL 1000 MG/100ML IV EMUL
INTRAVENOUS | Status: AC
  Filled 2018-05-20: qty 200

## 2018-05-20 MED ORDER — CHLORHEXIDINE GLUCONATE 4 % EX LIQD: 60.0000 mL | Freq: Once | CUTANEOUS | Status: DC

## 2018-05-20 MED ORDER — MIDAZOLAM HCL 2 MG/2ML IJ SOLN
2.0000 mg | Freq: Once | INTRAMUSCULAR | Status: AC
  Administered 2018-05-20: 2 mg via INTRAVENOUS

## 2018-05-20 MED ORDER — CEFAZOLIN SODIUM-DEXTROSE 2-4 GM/100ML-% IV SOLN
INTRAVENOUS | Status: AC
  Filled 2018-05-20: qty 100

## 2018-05-20 MED ORDER — LACTATED RINGERS IV SOLN
INTRAVENOUS | Status: DC | PRN
  Administered 2018-05-20 (×2): via INTRAVENOUS

## 2018-05-20 MED ORDER — ONDANSETRON HCL 4 MG/2ML IJ SOLN
INTRAMUSCULAR | Status: DC | PRN
  Administered 2018-05-20: 4 mg via INTRAVENOUS

## 2018-05-20 MED ORDER — DEXAMETHASONE SODIUM PHOSPHATE 10 MG/ML IJ SOLN
INTRAMUSCULAR | Status: DC | PRN
  Administered 2018-05-20: 10 mg via INTRAVENOUS

## 2018-05-20 MED ORDER — FENTANYL CITRATE (PF) 100 MCG/2ML IJ SOLN
50.0000 ug | Freq: Once | INTRAMUSCULAR | Status: AC
  Administered 2018-05-20: 50 ug via INTRAVENOUS

## 2018-05-20 MED ORDER — LIDOCAINE HCL (CARDIAC) PF 100 MG/5ML IV SOSY
PREFILLED_SYRINGE | INTRAVENOUS | Status: DC | PRN
  Administered 2018-05-20: 50 mg via INTRATRACHEAL

## 2018-05-20 MED ORDER — BUPIVACAINE-EPINEPHRINE (PF) 0.5% -1:200000 IJ SOLN
INTRAMUSCULAR | Status: DC | PRN
  Administered 2018-05-20: 20 mL via PERINEURAL
  Administered 2018-05-20: 10 mL via PERINEURAL

## 2018-05-20 MED ORDER — MIDAZOLAM HCL 2 MG/2ML IJ SOLN
INTRAMUSCULAR | Status: AC
  Filled 2018-05-20: qty 2

## 2018-05-20 MED ORDER — GLYCOPYRROLATE PF 0.2 MG/ML IJ SOSY
PREFILLED_SYRINGE | INTRAMUSCULAR | Status: DC | PRN
  Administered 2018-05-20: .1 mg via INTRAVENOUS

## 2018-05-20 MED ORDER — ALBUTEROL SULFATE HFA 108 (90 BASE) MCG/ACT IN AERS
INHALATION_SPRAY | RESPIRATORY_TRACT | Status: DC | PRN
  Administered 2018-05-20: 2 via RESPIRATORY_TRACT

## 2018-05-20 MED ORDER — ONDANSETRON HCL 4 MG/2ML IJ SOLN
INTRAMUSCULAR | Status: AC
  Filled 2018-05-20: qty 2

## 2018-05-20 MED ORDER — MIDAZOLAM HCL 2 MG/2ML IJ SOLN
INTRAMUSCULAR | Status: AC
  Administered 2018-05-20: 2 mg via INTRAVENOUS
  Filled 2018-05-20: qty 2

## 2018-05-20 MED ORDER — HYDROCODONE-ACETAMINOPHEN 5-325 MG PO TABS: 1.0000 | ORAL_TABLET | ORAL | 0 refills | Status: DC | PRN

## 2018-05-20 MED ORDER — FENTANYL CITRATE (PF) 250 MCG/5ML IJ SOLN
INTRAMUSCULAR | Status: AC
  Filled 2018-05-20: qty 5

## 2018-05-20 MED ORDER — FENTANYL CITRATE (PF) 100 MCG/2ML IJ SOLN
INTRAMUSCULAR | Status: AC
  Administered 2018-05-20: 50 ug via INTRAVENOUS
  Filled 2018-05-20: qty 2

## 2018-05-20 MED ORDER — ONDANSETRON HCL 4 MG/2ML IJ SOLN: 4.0000 mg | Freq: Once | INTRAMUSCULAR | Status: DC | PRN

## 2018-05-20 MED ORDER — PROPOFOL 500 MG/50ML IV EMUL
INTRAVENOUS | Status: DC | PRN
  Administered 2018-05-20: 25 ug/kg/min via INTRAVENOUS

## 2018-05-20 MED ORDER — MIDAZOLAM HCL 2 MG/2ML IJ SOLN
INTRAMUSCULAR | Status: DC | PRN
  Administered 2018-05-20 (×2): 1 mg via INTRAVENOUS

## 2018-05-20 MED ORDER — PHENYLEPHRINE HCL 10 MG/ML IJ SOLN
INTRAMUSCULAR | Status: DC | PRN
  Administered 2018-05-20 (×4): 80 ug via INTRAVENOUS

## 2018-05-20 MED ORDER — PROPOFOL 10 MG/ML IV BOLUS
INTRAVENOUS | Status: DC | PRN
  Administered 2018-05-20: 25 mg via INTRAVENOUS

## 2018-05-20 MED ORDER — PROPOFOL 10 MG/ML IV BOLUS
INTRAVENOUS | Status: AC
  Filled 2018-05-20: qty 20

## 2018-05-20 MED ORDER — LIDOCAINE 2% (20 MG/ML) 5 ML SYRINGE
INTRAMUSCULAR | Status: AC
  Filled 2018-05-20: qty 5

## 2018-05-20 MED ORDER — FENTANYL CITRATE (PF) 100 MCG/2ML IJ SOLN: 25.0000 ug | INTRAMUSCULAR | Status: DC | PRN

## 2018-05-20 MED ORDER — DEXAMETHASONE SODIUM PHOSPHATE 10 MG/ML IJ SOLN
INTRAMUSCULAR | Status: AC
  Filled 2018-05-20: qty 1

## 2018-05-20 SURGICAL SUPPLY — 39 items
BIT DRILL 1.7 (BIT) ×2 IMPLANT
BLADE AVERAGE 25MMX9MM (BLADE)
BLADE AVERAGE 25X9 (BLADE) IMPLANT
BLADE MINI RND TIP GREEN BEAV (BLADE) IMPLANT
BNDG COHESIVE 4X5 TAN STRL (GAUZE/BANDAGES/DRESSINGS) ×2 IMPLANT
BNDG ESMARK 4X9 LF (GAUZE/BANDAGES/DRESSINGS) ×3 IMPLANT
BNDG GAUZE ELAST 4 BULKY (GAUZE/BANDAGES/DRESSINGS) ×2 IMPLANT
COTTON STERILE ROLL (GAUZE/BANDAGES/DRESSINGS) IMPLANT
COVER SURGICAL LIGHT HANDLE (MISCELLANEOUS) ×4 IMPLANT
DRAPE OEC MINIVIEW 54X84 (DRAPES) IMPLANT
DRAPE U-SHAPE 47X51 STRL (DRAPES) ×3 IMPLANT
DRSG ADAPTIC 3X8 NADH LF (GAUZE/BANDAGES/DRESSINGS) ×2 IMPLANT
DURAPREP 26ML APPLICATOR (WOUND CARE) ×3 IMPLANT
ELECT REM PT RETURN 9FT ADLT (ELECTROSURGICAL) ×3
ELECTRODE REM PT RTRN 9FT ADLT (ELECTROSURGICAL) ×1 IMPLANT
GAUZE SPONGE 4X4 12PLY STRL LF (GAUZE/BANDAGES/DRESSINGS) ×2 IMPLANT
GLOVE BIOGEL PI IND STRL 9 (GLOVE) ×1 IMPLANT
GLOVE BIOGEL PI INDICATOR 9 (GLOVE) ×2
GLOVE SURG ORTHO 9.0 STRL STRW (GLOVE) ×3 IMPLANT
GOWN STRL REUS W/ TWL XL LVL3 (GOWN DISPOSABLE) ×2 IMPLANT
GOWN STRL REUS W/TWL XL LVL3 (GOWN DISPOSABLE) ×4
KIT BASIN OR (CUSTOM PROCEDURE TRAY) ×3 IMPLANT
KIT TURNOVER KIT B (KITS) ×3 IMPLANT
MANIFOLD NEPTUNE II (INSTRUMENTS) ×3 IMPLANT
NS IRRIG 1000ML POUR BTL (IV SOLUTION) ×3 IMPLANT
PACK ORTHO EXTREMITY (CUSTOM PROCEDURE TRAY) ×3 IMPLANT
PAD ARMBOARD 7.5X6 YLW CONV (MISCELLANEOUS) ×6 IMPLANT
PAD CAST 4YDX4 CTTN HI CHSV (CAST SUPPLIES) IMPLANT
PADDING CAST COTTON 4X4 STRL (CAST SUPPLIES)
PLATE OPEN WEDGE SM FRAG LT (Plate) ×2 IMPLANT
SCREW CORTICAL 2.3X14MM (Screw) ×2 IMPLANT
SCREW CORTICAL 2.3X16 (Screw) ×4 IMPLANT
SCREW CORTICAL 2.3X20 (Screw) ×4 IMPLANT
SCREW CORTICAL 2.5X26MM (Screw) ×2 IMPLANT
SUCTION FRAZIER HANDLE 10FR (MISCELLANEOUS)
SUCTION TUBE FRAZIER 10FR DISP (MISCELLANEOUS) IMPLANT
SUT ETHILON 2 0 FS 18 (SUTURE) ×2 IMPLANT
SUT VIC AB 2-0 FS1 27 (SUTURE) IMPLANT
WATER STERILE IRR 1000ML POUR (IV SOLUTION) ×3 IMPLANT

## 2018-05-20 NOTE — Anesthesia Postprocedure Evaluation (Signed)
Anesthesia Post Note  Patient: Cathy Liu  Procedure(s) Performed: DOUBLE OSTEOTOMY RIGHT 1ST METATARSAL, WEIL OSTEOTOMY RIGHT 2ND METATARSAL (Right )     Patient location during evaluation: PACU Anesthesia Type: Regional Level of consciousness: awake and alert Pain management: pain level controlled Vital Signs Assessment: post-procedure vital signs reviewed and stable Respiratory status: spontaneous breathing, nonlabored ventilation and respiratory function stable Cardiovascular status: stable and blood pressure returned to baseline Anesthetic complications: no    Last Vitals:  Vitals:   05/20/18 1222 05/20/18 1238  BP: 138/83 (!) 148/74  Pulse: (!) 114 (!) 104  Resp:  (!) 23  Temp: 36.5 C   SpO2: 99% 99%    Last Pain:  Vitals:   05/20/18 1238  TempSrc:   PainSc: 0-No pain                 Beryle Lathehomas E Brock

## 2018-05-20 NOTE — Op Note (Signed)
05/20/2018  12:25 PM  PATIENT:  Cathy Liu    PRE-OPERATIVE DIAGNOSIS:  Bunion and Claw Toe Right Foot  POST-OPERATIVE DIAGNOSIS:  Same  PROCEDURE:  DOUBLE OSTEOTOMY RIGHT 1ST METATARSAL, WEIL OSTEOTOMY RIGHT 2ND METATARSAL, closing wedge osteotomy proximal phalanx right great toe.  C-arm fluoroscopy to verify reduction.  SURGEON:  Nadara MustardMarcus V Duda, MD  PHYSICIAN ASSISTANT:None ANESTHESIA:   General  PREOPERATIVE INDICATIONS:  Cathy Liu is a  68 y.o. female with a diagnosis of Bunion and Claw Toe Right Foot who failed conservative measures and elected for surgical management.    The risks benefits and alternatives were discussed with the patient preoperatively including but not limited to the risks of infection, bleeding, nerve injury, cardiopulmonary complications, the need for revision surgery, among others, and the patient was willing to proceed.  OPERATIVE IMPLANTS: Arthrex bunion plate.  @ENCIMAGES @  OPERATIVE FINDINGS: Postoperative radiographs showed stable alignment and straightening of the bunion deformity.  OPERATIVE PROCEDURE: Patient was brought the operating room underwent a regional anesthetic.  After adequate levels anesthesia obtained patient's right lower extremity was prepped using DuraPrep draped into a sterile field a timeout was called.  2 medial incisions were made over the right foot 1 of the base of the first metatarsal 1 over the MTP joint.  The incision at the base of the first metatarsal was carried down to the bone a soft cut was then made through the first metatarsal 1 cm distal to the base of the first metatarsal.  A opening wedge osteotomy was performed and a plate was placed with 5 mm of an osteotomy distraction.  This was secured proximally and distally with 2 screws.  Attention was then focused on the MTP joint.  Ostectomy was first performed of the metatarsal head.  And a closing wedge osteotomy was made at the base of the proximal phalanx right  great toe.  The alignment was corrected a K wire was inserted.  The bone was then used from the tuberosity ectomy's for bone graft for the opening wedge osteotomy of the base of the first metatarsal.  A third incision was then made over the MTP joint of the second toe.  This was carried down to the joint a Weil osteotomy was performed metatarsal head was translated proximally overhanging bone was resected and this was stabilized with a 2 x 12 mm mini frag screw.  The abductors were released from the lateral border of the MTP joint of the great toe to decrease tension on the repair.  Wounds were irrigated with normal saline incisions were closed using 2-0 nylon.  Sterile dressing was applied patient was taken the PACU in stable condition.   DISCHARGE PLANNING:  Antibiotic duration: Preoperative antibiotics  Weightbearing: Touchdown weightbearing  Pain medication: Prescription for Vicodin  Dressing care/ Wound VAC: Change dressing in follow-up  Ambulatory devices: Walker  Discharge to: Home  Follow-up: In the office 1 week post operative.

## 2018-05-20 NOTE — Discharge Instructions (Signed)
Metatarsal Osteotomy, Care After Refer to this sheet in the next few weeks. These instructions provide you with information about caring for yourself after your procedure. Your health care provider may also give you more specific instructions. Your treatment has been planned according to current medical practices, but problems sometimes occur. Call your health care provider if you have any problems or questions after your procedure. What can I expect after the procedure? After the procedure, it is common to have:  Soreness.  Pain.  Stiffness.  Swelling. Follow these instructions at home: If you have a splint:  Wear the splint as told by your health care provider. Remove it only as told by your health care provider.  Loosen the splint if your toes tingle, become numb, or turn cold and blue.  Do not let your splint get wet if it is not waterproof.  Keep the splint clean. Bathing  Do not take baths, swim, or use a hot tub until your health care provider approves. Ask your health care provider if you can take showers. You may only be allowed to take sponge baths for bathing.  If your splint is not waterproof, cover it with a watertight plastic bag when you take a bath or a shower.  Keep the bandage (dressing) dry until your health care provider says it can be removed. Incision care  Follow instructions from your health care provider about how to take care of your cut from surgery (incision). Make sure you: ? Wash your hands with soap and water before you change your bandage (dressing). If soap and water are not available, use hand sanitizer. ? Change your dressing as told by your health care provider. ? Leave stitches (sutures), skin glue, or adhesive strips in place. These skin closures may need to stay in place for 2 weeks or longer. If adhesive strip edges start to loosen and curl up, you may trim the loose edges. Do not remove adhesive strips completely unless your health care  provider tells you to do that.  Check your incision area every day for signs of infection. Check for: ? More redness, swelling, or pain. ? More fluid or blood. ? Warmth. ? Pus or a bad smell. Managing pain, stiffness, and swelling   If directed, apply ice to the injured area. ? Put ice in a plastic bag. ? Place a towel between your skin and the bag. ? Leave the ice on for 20 minutes, 2-3 times a day.  Move your toes often to avoid stiffness and to lessen swelling.  Raise (elevate) the injured area above the level of your heart while you are sitting or lying down. Driving  Do not drive or operate heavy machinery while taking prescription pain medicine.  Do not drive for 24 hours if you received a sedative.  Ask your health care provider when it is safe to drive if you have a dressing, splint, special shoe, or walking boot on your foot. General instructions  If you were given a splint, special shoe, or walking boot, wear it as told by your health care provider.  Return to your normal activities as told by your health care provider. Ask your health care provider what activities are safe for you.  Do not use the injured limb to support your body weight until your health care provider says that you can. Use crutches or a walker as told by your health care provider.  Do not use any tobacco products, such as cigarettes, chewing tobacco, and e-cigarettes.  Tobacco can delay bone healing. If you need help quitting, ask your health care provider.  Take over-the-counter and prescription medicines only as told by your health care provider.  Keep all follow-up visits as told by your health care provider. This is important. Contact a health care provider if:  You have a fever.  Your dressing becomes wet, loose, or stained with blood or discharge.  You have pus or a bad smell coming from your incision or bandage.  Your foot becomes red, swollen, or tender.  You have pain or stiffness  that does not get better or gets worse.  You have tingling or numbness in your foot that does not get better or gets worse. Get help right away if:  You develop a warm and tender swelling in your leg.  You have chest pain.  You have trouble breathing. This information is not intended to replace advice given to you by your health care provider. Make sure you discuss any questions you have with your health care provider. Document Released: 04/10/2015 Document Revised: 10/05/2015 Document Reviewed: 12/22/2014 Elsevier Interactive Patient Education  2019 [employer].   Post Anesthesia Home Care Instructions  Activity: Get plenty of rest for the remainder of the day. A responsible individual must stay with you for 24 hours following the procedure.  For the next 24 hours, DO NOT: -Drive a car -Advertising copywriter -Drink alcoholic beverages -Take any medication unless instructed by your physician -Make any legal decisions or sign important papers.  Meals: Start with liquid foods such as gelatin or soup. Progress to regular foods as tolerated. Avoid greasy, spicy, heavy foods. If nausea and/or vomiting occur, drink only clear liquids until the nausea and/or vomiting subsides. Call your physician if vomiting continues.  Special Instructions/Symptoms: Your throat may feel dry or sore from the anesthesia or the breathing tube placed in your throat during surgery. If this causes discomfort, gargle with warm salt water. The discomfort should disappear within 24 hours.  If you had a scopolamine patch placed behind your ear for the management of post- operative nausea and/or vomiting:  1. The medication in the patch is effective for 72 hours, after which it should be removed.  Wrap patch in a tissue and discard in the trash. Wash hands thoroughly with soap and water. 2. You may remove the patch earlier than 72 hours if you experience unpleasant side effects which may include dry mouth, dizziness  or visual disturbances. 3. Avoid touching the patch. Wash your hands with soap and water after contact with the patch.

## 2018-05-20 NOTE — Anesthesia Procedure Notes (Signed)
Anesthesia Regional Block: Adductor canal block   Pre-Anesthetic Checklist: ,, timeout performed, Correct Patient, Correct Site, Correct Laterality, Correct Procedure, Correct Position, site marked, Risks and benefits discussed,  Surgical consent,  Pre-op evaluation,  At surgeon's request and post-op pain management  Laterality: Right  Prep: chloraprep       Needles:  Injection technique: Single-shot  Needle Type: Echogenic Needle     Needle Length: 9cm  Needle Gauge: 21     Additional Needles:   Narrative:  Start time: 05/20/2018 10:15 AM End time: 05/20/2018 10:17 AM Injection made incrementally with aspirations every 5 mL.  Performed by: Personally  Anesthesiologist: Beryle Lathe, MD  Additional Notes: No pain on injection. No increased resistance to injection. Injection made in 5cc increments. Good needle visualization. Patient tolerated the procedure well.

## 2018-05-20 NOTE — Transfer of Care (Signed)
Immediate Anesthesia Transfer of Care Note  Patient: Cathy Liu  Procedure(s) Performed: DOUBLE OSTEOTOMY RIGHT 1ST METATARSAL, WEIL OSTEOTOMY RIGHT 2ND METATARSAL (Right )  Patient Location: PACU  Anesthesia Type:MAC and Regional  Level of Consciousness: awake, alert  and oriented  Airway & Oxygen Therapy: Patient Spontanous Breathing and Patient connected to nasal cannula oxygen  Post-op Assessment: Report given to RN, Post -op Vital signs reviewed and stable, Patient moving all extremities X 4 and Patient able to stick tongue midline  Post vital signs: Reviewed and stable  Last Vitals:  Vitals Value Taken Time  BP 138/83 05/20/2018 12:22 PM  Temp 36.5 C 05/20/2018 12:22 PM  Pulse 113 05/20/2018 12:24 PM  Resp 25 05/20/2018 12:24 PM  SpO2 100 % 05/20/2018 12:24 PM  Vitals shown include unvalidated device data.  Last Pain:  Vitals:   05/20/18 0932  TempSrc:   PainSc: 6       Patients Stated Pain Goal: 2 (90/24/09 7353)  Complications: No apparent anesthesia complications

## 2018-05-20 NOTE — Anesthesia Procedure Notes (Signed)
Anesthesia Regional Block: Popliteal block   Pre-Anesthetic Checklist: ,, timeout performed, Correct Patient, Correct Site, Correct Laterality, Correct Procedure, Correct Position, site marked, Risks and benefits discussed,  Surgical consent,  Pre-op evaluation,  At surgeon's request and post-op pain management  Laterality: Right  Prep: chloraprep       Needles:  Injection technique: Single-shot  Needle Type: Echogenic Needle     Needle Length: 9cm  Needle Gauge: 21     Additional Needles:   Narrative:  Start time: 05/20/2018 10:10 AM End time: 05/20/2018 10:14 AM Injection made incrementally with aspirations every 5 mL.  Performed by: Personally  Anesthesiologist: Beryle Lathe, MD  Additional Notes: No pain on injection. No increased resistance to injection. Injection made in 5cc increments. Good needle visualization. Patient tolerated the procedure well.

## 2018-05-20 NOTE — Progress Notes (Signed)
Orthopedic Tech Progress Note Patient Details:  Cathy Liu 08/26/1950 [id number] Post op shoe was putting on in pacu. Ortho Devices Type of Ortho Device: Postop shoe/boot Ortho Device/Splint Location: Rt foot Ortho Device/Splint Interventions: Application   Post Interventions Patient Tolerated: Well Instructions Provided: Adjustment of device, Care of device   Tawni Carnes [facility] 05/20/2018, 12:47 PM

## 2018-05-20 NOTE — H&P (Signed)
Cathy Liu is an 68 y.o. female.   Chief Complaint: painfull bunion and claw toe right foot HPI: Patient is a 68 year old woman who presents with increasing bunion deformities of both feet with increasing pain she has tried wider shoe wear without relief with the overlapping of the toes she states that she is almost developing ulcers on the great toe and second toe.  Past Medical History:  Diagnosis Date  . Arthritis    back and knees  . Asthma    exercise  and cold weather induced;Albuterol inhaler prn  . Depression    takes Zoloft daily  . Headache(784.0) last one many yrs ago   takes Topamax nightly and Imitrex daily  . History of bronchitis 2010  . History of panic attacks several yrs ago  . Hyperlipidemia    borderline but no meds required  . Hypertension    takes Enalapril daily  . Joint pain   . Joint swelling left knee  . Lordosis   . Osteoarthritis   . Scoliosis   . Shortness of breath    with exertion and very cold weather  . Urinary frequency     Past Surgical History:  Procedure Laterality Date  . ADENOIDECTOMY  as a child  . CHOLECYSTECTOMY    . DILATION AND CURETTAGE OF UTERUS    . JOINT REPLACEMENT Left   . KNEE ARTHROSCOPY Bilateral   . RHINOPLASTY  as a child  . thyroid cyst drained  2006  . TONSILLECTOMY  as a child  . TOTAL KNEE ARTHROPLASTY Left 11/24/2013   DR Leevi Cullars  . TOTAL KNEE ARTHROPLASTY Left 11/24/2013   Procedure: TOTAL KNEE ARTHROPLASTY;  Surgeon: Nadara Mustard, MD;  Location: [facility];  Service: Orthopedics;  Laterality: Left;  Left Total Knee Arthroplasty, Removal Uni knee  . VAGINAL HYSTERECTOMY     with tubes and ovaries    No family history on file. Social History:  reports that she has never smoked. She has never used smokeless tobacco. She reports that she does not drink alcohol or use drugs.  Allergies:  Allergies  Allergen Reactions  . Meloxicam Other (See Comments)    "I thought I was having a stoke, contractions of my  arms, some chest pain, elevated troponins"  . Shellfish-Derived Products Anaphylaxis    CRAB MEAT  . Synvisc [Hylan G-F 20] Shortness Of Breath, Anxiety and Other (See Comments)    Verticulitis   . Chocolate Other (See Comments)    MIGRAINE   . Oxycodone     confusion  . Red Wine Complex [Germanium] Other (See Comments)    MIGRAINE     No medications prior to admission.    No results found for this or any previous visit (from the past 48 hour(s)). No results found.  Review of Systems  All other systems reviewed and are negative.   There were no vitals taken for this visit. Physical Exam  Patient is alert, oriented, no adenopathy, well-dressed, normal affect, normal respiratory effort. Examination patient has a normal gait she has good dorsalis pedis pulses she has good dorsiflexion of the ankle and subtalar joint.  She has overlapping of the great toe on the second toe with severe hallux valgus deformity worse on the right than the left.  She has developed no ulcers.  There is no pain with range of motion of the MTP joint.  She does have tenderness to palpation beneath the second metatarsal head on the right.  She is developing some  skin discoloration where the toes are overlapping.  Assessment/Plan 1. Pain in right foot   2. Bunion of great toe of right foot   3. Claw toe, acquired, right     Plan: Due to failure conservative care patient states she would like to proceed with surgical intervention.  We will plan for a proximal and distal osteotomy of the first metatarsal a Weil osteotomy for the second metatarsal plan for outpatient surgery at [facility].  We will set this up as per her recommendation.    Nadara Mustard, MD 05/20/2018, 6:46 AM

## 2018-05-21 ENCOUNTER — Encounter (HOSPITAL_COMMUNITY): Payer: Self-pay | Admitting: Orthopedic Surgery

## 2018-05-28 ENCOUNTER — Encounter (INDEPENDENT_AMBULATORY_CARE_PROVIDER_SITE_OTHER): Payer: Self-pay | Admitting: Physician Assistant

## 2018-05-28 ENCOUNTER — Ambulatory Visit (INDEPENDENT_AMBULATORY_CARE_PROVIDER_SITE_OTHER): Payer: Medicare Other | Admitting: Physician Assistant

## 2018-05-28 VITALS — Ht 60.0 in | Wt 110.0 lb

## 2018-05-28 DIAGNOSIS — M21611 Bunion of right foot: Secondary | ICD-10-CM

## 2018-05-28 MED ORDER — HYDROCODONE-ACETAMINOPHEN 5-325 MG PO TABS: 1.0000 | ORAL_TABLET | ORAL | 0 refills | Status: DC | PRN

## 2018-05-29 ENCOUNTER — Encounter (INDEPENDENT_AMBULATORY_CARE_PROVIDER_SITE_OTHER): Payer: Self-pay | Admitting: Physician Assistant

## 2018-05-29 NOTE — Progress Notes (Signed)
Office Visit Note   Patient: Cathy Liu           Date of Birth: 1950-07-19           MRN: [id number] Visit Date: 05/28/2018              Requested by: Jon Gills, MD [address] [place], [place] [address] PCP: Jon Gills, MD  Chief Complaint  Patient presents with  . Right Foot - Routine Post Op    05/20/2018 double osteotomy right 1st MT, Weil osteotomy right 2nd MT closing wedge osteotomy proximal phalanx right GT      HPI: The patient is a 68 year old woman here for postoperative follow-up following double osteotomy right first metatarsal, we will osteotomy right second metatarsal, closing wedge osteotomy proximal phalanx right great toe on 05/20/2018 for bunion and claw toe deformity of the right foot.. She is 1 week postop.  She is been trying to keep her foot elevated as much as possible and nonweightbearing.  She is utilizing a rolling walker. Assessment & Plan: Visit Diagnoses:  1. Bunion of great toe of right foot     Plan: Continue nonweightbearing over the right foot and utilizing a rolling walker.  Elevate as much as possible.  Dry gauze to the incisional area and Ace wrap to help control edema.  She will follow-up here in 1 week.  Her Norco was refilled this visit.  Follow-Up Instructions: Return in about 1 week (around 06/04/2018).   Ortho Exam  Patient is alert, oriented, no adenopathy, well-dressed, normal affect, normal respiratory effort. Right foot incisions are clean dry and intact but she does have swelling and bruising.  Her pedal pulses are good.  There are no signs of cellulitis or infection.  Imaging: No results found. No images are attached to the encounter.  Labs: No results found for: HGBA1C, ESRSEDRATE, CRP, LABURIC, REPTSTATUS, GRAMSTAIN, CULT, LABORGA   Lab Results  Component Value Date   ALBUMIN 4.4 11/11/2013    Body mass index is 21.48 kg/m.  Orders:  No orders of the defined types were placed in this  encounter.  Meds ordered this encounter  Medications  . HYDROcodone-acetaminophen (NORCO/VICODIN) 5-325 MG tablet    Sig: Take 1 tablet by mouth every 4 (four) hours as needed for moderate pain.    Dispense:  30 tablet    Refill:  0     Procedures: No procedures performed  Clinical Data: No additional findings.  ROS:  All other systems negative, except as noted in the HPI. Review of Systems  Objective: Vital Signs: Ht 5' (1.524 m)   Wt 110 lb (49.9 kg)   BMI 21.48 kg/m   Specialty Comments:  No specialty comments available.  PMFS History: Patient Active Problem List   Diagnosis Date Noted  . Bunion of great toe of right foot   . Claw toe, acquired, right   . Knee osteoarthritis 11/27/2013  . 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 Arthritis    back and knees  . Asthma    exercise  and cold weather induced;Albuterol inhaler prn  . Depression    takes Zoloft daily  . Headache(784.0) last one many yrs ago   takes Topamax nightly and Imitrex daily  . History of bronchitis 2010  . History of panic attacks several yrs ago  . Hyperlipidemia    borderline but no meds required  . Hypertension    takes Enalapril daily  .  Joint pain   . Joint swelling left knee  . Lordosis   . Osteoarthritis   . Scoliosis   . Shortness of breath    with exertion and very cold weather  . Urinary frequency     History reviewed. No pertinent family history.  Past Surgical History:  Procedure Laterality Date  . ADENOIDECTOMY  as a child  . CHOLECYSTECTOMY    . DILATION AND CURETTAGE OF UTERUS    . JOINT REPLACEMENT Left   . KNEE ARTHROSCOPY Bilateral   . RHINOPLASTY  as a child  . thyroid cyst drained  2006  . TONSILLECTOMY  as a child  . TOTAL KNEE ARTHROPLASTY Left 11/24/2013   DR DUDA  . TOTAL KNEE ARTHROPLASTY Left 11/24/2013   Procedure: TOTAL KNEE ARTHROPLASTY;  Surgeon: Nadara MustardMarcus Duda V, MD;  Location: [facility];  Service:  Orthopedics;  Laterality: Left;  Left Total Knee Arthroplasty, Removal Uni knee  . VAGINAL HYSTERECTOMY     with tubes and ovaries  . WEIL OSTEOTOMY Right 05/20/2018   Procedure: DOUBLE OSTEOTOMY RIGHT 1ST METATARSAL, WEIL OSTEOTOMY RIGHT 2ND METATARSAL;  Surgeon: Nadara Mustarduda, Marcus V, MD;  Location: [facility];  Service: Orthopedics;  Laterality: Right;   Social History   Occupational History  . Not on file  Tobacco Use  . Smoking status: Never Smoker  . Smokeless tobacco: Never Used  Substance and Sexual Activity  . Alcohol use: No  . Drug use: No  . Sexual activity: Not Currently    Birth control/protection: Surgical

## 2018-06-04 ENCOUNTER — Encounter (INDEPENDENT_AMBULATORY_CARE_PROVIDER_SITE_OTHER): Payer: Self-pay | Admitting: Orthopedic Surgery

## 2018-06-04 ENCOUNTER — Ambulatory Visit (INDEPENDENT_AMBULATORY_CARE_PROVIDER_SITE_OTHER): Payer: Medicare Other | Admitting: Orthopedic Surgery

## 2018-06-04 VITALS — Ht 60.0 in | Wt 110.0 lb

## 2018-06-04 DIAGNOSIS — M205X1 Other deformities of toe(s) (acquired), right foot: Secondary | ICD-10-CM

## 2018-06-04 DIAGNOSIS — M21611 Bunion of right foot: Secondary | ICD-10-CM

## 2018-06-04 MED ORDER — HYDROCODONE-ACETAMINOPHEN 5-325 MG PO TABS: 1.0000 | ORAL_TABLET | ORAL | 0 refills | Status: DC | PRN

## 2018-06-05 ENCOUNTER — Encounter (INDEPENDENT_AMBULATORY_CARE_PROVIDER_SITE_OTHER): Payer: Self-pay | Admitting: Orthopedic Surgery

## 2018-06-05 NOTE — Progress Notes (Signed)
Office Visit Note   Patient: Cathy Liu           Date of Birth: 08/23/1950           MRN: [id number] Visit Date: 06/04/2018              Requested by: Jon Gills, MD [address] [place], [place] [address] PCP: Jon Gills, MD  Chief Complaint  Patient presents with  . Right Foot - Pain    05/20/2018 double osteotomy right 1st MT, Weil osteotomy right 2nd MT closing wedge osteotomy proximal phalanx right GT        HPI: Patient is a 68 year old woman who is 2 weeks status post double osteotomy for the first metatarsal bunion deformity and Weil osteotomy for the second metatarsal.  Patient states she has some pain in the foot she is keeping her foot elevated and has developed some back spasms from the elevation.  Assessment & Plan: Visit Diagnoses:  1. Claw toe, acquired, right   2. Bunion of great toe of right foot     Plan: Patient will begin Dial soap cleansing dry dressing changes a silicone pad was applied to the second toe with a mouse pad to keep the second toe from floating.  Removed the hand at follow-up in 1 week.  Follow-Up Instructions: Return in about 1 week (around 06/11/2018).   Ortho Exam  Patient is alert, oriented, no adenopathy, well-dressed, normal affect, normal respiratory effort. On examination the surgical incisions are well approximated there is some mild swelling no drainage no cellulitis no signs of infection.  Silicone sleeve and a mouse pad were applied to the second toe to prevent floating.  Imaging: No results found. No images are attached to the encounter.  Labs: No results found for: HGBA1C, ESRSEDRATE, CRP, LABURIC, REPTSTATUS, GRAMSTAIN, CULT, LABORGA   Lab Results  Component Value Date   ALBUMIN 4.4 11/11/2013    Body mass index is 21.48 kg/m.  Orders:  No orders of the defined types were placed in this encounter.  Meds ordered this encounter  Medications  . HYDROcodone-acetaminophen (NORCO/VICODIN)  5-325 MG tablet    Sig: Take 1 tablet by mouth every 4 (four) hours as needed for moderate pain.    Dispense:  30 tablet    Refill:  0     Procedures: No procedures performed  Clinical Data: No additional findings.  ROS:  All other systems negative, except as noted in the HPI. Review of Systems  Objective: Vital Signs: Ht 5' (1.524 m)   Wt 110 lb (49.9 kg)   BMI 21.48 kg/m   Specialty Comments:  No specialty comments available.  PMFS History: Patient Active Problem List   Diagnosis Date Noted  . Bunion of great toe of right foot   . Claw toe, acquired, right   . Knee osteoarthritis 11/27/2013  . 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 Arthritis    back and knees  . Asthma    exercise  and cold weather induced;Albuterol inhaler prn  . Depression    takes Zoloft daily  . Headache(784.0) last one many yrs ago   takes Topamax nightly and Imitrex daily  . History of bronchitis 2010  . History of panic attacks several yrs ago  . Hyperlipidemia    borderline but no meds required  . Hypertension    takes Enalapril daily  . Joint pain   . Joint swelling left knee  .  Lordosis   . Osteoarthritis   . Scoliosis   . Shortness of breath    with exertion and very cold weather  . Urinary frequency     History reviewed. No pertinent family history.  Past Surgical History:  Procedure Laterality Date  . ADENOIDECTOMY  as a child  . CHOLECYSTECTOMY    . DILATION AND CURETTAGE OF UTERUS    . JOINT REPLACEMENT Left   . KNEE ARTHROSCOPY Bilateral   . RHINOPLASTY  as a child  . thyroid cyst drained  2006  . TONSILLECTOMY  as a child  . TOTAL KNEE ARTHROPLASTY Left 11/24/2013   DR   . TOTAL KNEE ARTHROPLASTY Left 11/24/2013   Procedure: TOTAL KNEE ARTHROPLASTY;  Surgeon: Nadara Mustard  V, MD;  Location: [facility];  Service: Orthopedics;  Laterality: Left;  Left Total Knee Arthroplasty, Removal Uni knee  . VAGINAL HYSTERECTOMY      with tubes and ovaries  . WEIL OSTEOTOMY Right 05/20/2018   Procedure: DOUBLE OSTEOTOMY RIGHT 1ST METATARSAL, WEIL OSTEOTOMY RIGHT 2ND METATARSAL;  Surgeon: Nadara Mustarduda,  V, MD;  Location: [facility] OR;  Service: Orthopedics;  Laterality: Right;   Social History   Occupational History  . Not on file  Tobacco Use  . Smoking status: Never Smoker  . Smokeless tobacco: Never Used  Substance and Sexual Activity  . Alcohol use: No  . Drug use: No  . Sexual activity: Not Currently    Birth control/protection: Surgical

## 2018-06-11 ENCOUNTER — Encounter (INDEPENDENT_AMBULATORY_CARE_PROVIDER_SITE_OTHER): Payer: Self-pay | Admitting: Orthopedic Surgery

## 2018-06-11 ENCOUNTER — Ambulatory Visit (INDEPENDENT_AMBULATORY_CARE_PROVIDER_SITE_OTHER): Payer: Medicare Other | Admitting: Physician Assistant

## 2018-06-11 VITALS — Ht 60.0 in | Wt 110.0 lb

## 2018-06-11 DIAGNOSIS — M205X1 Other deformities of toe(s) (acquired), right foot: Secondary | ICD-10-CM

## 2018-06-11 DIAGNOSIS — M21611 Bunion of right foot: Secondary | ICD-10-CM

## 2018-06-11 MED ORDER — DOXYCYCLINE HYCLATE 100 MG PO CAPS: 100.0000 mg | ORAL_CAPSULE | Freq: Two times a day (BID) | ORAL | 1 refills | Status: DC

## 2018-06-11 NOTE — Progress Notes (Signed)
Office Visit Note   Patient: Cathy Liu           Date of Birth: July 05, 1950           MRN: [id number] Visit Date: 06/11/2018              Requested by: Jon Gillsassidy-Vu, Lisa, MD [address] [place], [place] [id number] PCP: Jon Gillsassidy-Vu, Lisa, MD  Chief Complaint  Patient presents with  . Right Foot - Routine Post Op    05/20/2018 double osteotomy right 1st MT, Weil osteotomy right 2nd MT closing wedge osteotomy proximal phalanx right GT       HPI: The patient is a 68 year old woman who is seen for postoperative follow-up following double osteotomy of the right first metatarsal and while Weil osteotomy of the right second metatarsal, closing wedge osteotomy proximal phalanx right great toe on 05/20/2018 for bunion and claw toe deformities. She reports that yesterday evening she developed increased pain in the right second toe and noted a pustule developed on the right second toe.  She did pop the pustule with her fingers and reports that white pus came out of the area.  She is concerned that there is some infection. Assessment & Plan: Visit Diagnoses:  1. Bunion of great toe of right foot   2. Claw toe, acquired, right     Plan: The right great toe pin was removed without difficulty.  Counseled to start doxycycline 100 mg p.o. twice daily.  She was provided a silicon sleeve for the right second toe as the mouse pad was rubbing the area and she can utilize a silicone sleeve and then the mouse pad to prevent this.  Continue minimizing weightbearing and elevate as much as possible.  She will follow-up in 1 week.  Follow-Up Instructions: Return in about 1 week (around 06/18/2018).   Ortho Exam  Patient is alert, oriented, no adenopathy, well-dressed, normal affect, normal respiratory effort. The right great toe pin was removed this visit and she tolerated this well. Her incisions are healing well but she does have a small raised erythematous area over the base of the second toe  without drainage today.  There is mild increase in edema.  She has good pedal pulses. Imaging: No results found.   Labs: No results found for: HGBA1C, ESRSEDRATE, CRP, LABURIC, REPTSTATUS, GRAMSTAIN, CULT, LABORGA   Lab Results  Component Value Date   ALBUMIN 4.4 11/11/2013    Body mass index is 21.48 kg/m.  Orders:  No orders of the defined types were placed in this encounter.  Meds ordered this encounter  Medications  . doxycycline (VIBRAMYCIN) 100 MG capsule    Sig: Take 1 capsule (100 mg total) by mouth 2 (two) times daily.    Dispense:  28 capsule    Refill:  1  . sulfamethoxazole-trimethoprim (BACTRIM DS,SEPTRA DS) 800-160 MG tablet    Sig: Take 1 tablet by mouth 2 (two) times daily.    Dispense:  14 tablet    Refill:  1     Procedures: No procedures performed  Clinical Data: No additional findings.  ROS:  All other systems negative, except as noted in the HPI. Review of Systems  Objective: Vital Signs: Ht 5' (1.524 m)   Wt 110 lb (49.9 kg)   BMI 21.48 kg/m   Specialty Comments:  No specialty comments available.  PMFS History: Patient Active Problem List   Diagnosis Date Noted  . Bunion of great toe of right foot   . Claw  toe, acquired, right   . Knee osteoarthritis 11/27/2013  . 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 Arthritis    back and knees  . Asthma    exercise  and cold weather induced;Albuterol inhaler prn  . Depression    takes Zoloft daily  . Headache(784.0) last one many yrs ago   takes Topamax nightly and Imitrex daily  . History of bronchitis 2010  . History of panic attacks several yrs ago  . Hyperlipidemia    borderline but no meds required  . Hypertension    takes Enalapril daily  . Joint pain   . Joint swelling left knee  . Lordosis   . Osteoarthritis   . Scoliosis   . Shortness of breath    with exertion and very cold weather  . Urinary frequency     History  reviewed. No pertinent family history.  Past Surgical History:  Procedure Laterality Date  . ADENOIDECTOMY  as a child  . CHOLECYSTECTOMY    . DILATION AND CURETTAGE OF UTERUS    . JOINT REPLACEMENT Left   . KNEE ARTHROSCOPY Bilateral   . RHINOPLASTY  as a child  . thyroid cyst drained  2006  . TONSILLECTOMY  as a child  . TOTAL KNEE ARTHROPLASTY Left 11/24/2013   DR DUDA  . TOTAL KNEE ARTHROPLASTY Left 11/24/2013   Procedure: TOTAL KNEE ARTHROPLASTY;  Surgeon: Nadara Mustard, MD;  Location: [facility];  Service: Orthopedics;  Laterality: Left;  Left Total Knee Arthroplasty, Removal Uni knee  . VAGINAL HYSTERECTOMY     with tubes and ovaries  . WEIL OSTEOTOMY Right 05/20/2018   Procedure: DOUBLE OSTEOTOMY RIGHT 1ST METATARSAL, WEIL OSTEOTOMY RIGHT 2ND METATARSAL;  Surgeon: Nadara Mustard, MD;  Location: [facility];  Service: Orthopedics;  Laterality: Right;   Social History   Occupational History  . Not on file  Tobacco Use  . Smoking status: Never Smoker  . Smokeless tobacco: Never Used  Substance and Sexual Activity  . Alcohol use: No  . Drug use: No  . Sexual activity: Not Currently    Birth control/protection: Surgical

## 2018-06-12 ENCOUNTER — Encounter (INDEPENDENT_AMBULATORY_CARE_PROVIDER_SITE_OTHER): Payer: Self-pay

## 2018-06-12 ENCOUNTER — Telehealth (INDEPENDENT_AMBULATORY_CARE_PROVIDER_SITE_OTHER): Payer: Self-pay

## 2018-06-12 ENCOUNTER — Encounter (INDEPENDENT_AMBULATORY_CARE_PROVIDER_SITE_OTHER): Payer: Self-pay | Admitting: Physician Assistant

## 2018-06-12 MED ORDER — SULFAMETHOXAZOLE-TRIMETHOPRIM 800-160 MG PO TABS: 1.0000 | ORAL_TABLET | Freq: Two times a day (BID) | ORAL | 1 refills | Status: DC

## 2018-06-12 NOTE — Telephone Encounter (Signed)
Will switch to Septra DS 1 po BID. Can you call and let her know I sent to her pharmacy. She will need to eat something to try and prevent nausea with this medication as well.

## 2018-06-12 NOTE — Telephone Encounter (Signed)
Patient called stating after she took the,Doxy she had nausea and vomiting Wanted to know what else to take instead

## 2018-06-12 NOTE — Telephone Encounter (Signed)
Pt was in the office yesterday for right foot and states that doxy that was given has caused N&V can she change to some thing else.

## 2018-06-12 NOTE — Telephone Encounter (Signed)
I called and sw pt to advise of message below to call with any questions.  

## 2018-06-18 ENCOUNTER — Ambulatory Visit (INDEPENDENT_AMBULATORY_CARE_PROVIDER_SITE_OTHER): Payer: Medicare Other | Admitting: Physician Assistant

## 2018-06-25 ENCOUNTER — Ambulatory Visit (INDEPENDENT_AMBULATORY_CARE_PROVIDER_SITE_OTHER): Payer: Medicare Other | Admitting: Physician Assistant

## 2018-07-07 ENCOUNTER — Ambulatory Visit (INDEPENDENT_AMBULATORY_CARE_PROVIDER_SITE_OTHER): Payer: Medicare Other | Admitting: Orthopedic Surgery

## 2018-07-07 ENCOUNTER — Encounter (INDEPENDENT_AMBULATORY_CARE_PROVIDER_SITE_OTHER): Payer: Self-pay | Admitting: Physician Assistant

## 2018-07-07 VITALS — Ht 60.0 in | Wt 110.0 lb

## 2018-07-07 DIAGNOSIS — M21611 Bunion of right foot: Secondary | ICD-10-CM

## 2018-07-07 DIAGNOSIS — M205X1 Other deformities of toe(s) (acquired), right foot: Secondary | ICD-10-CM

## 2018-07-07 NOTE — Progress Notes (Signed)
Office Visit Note   Patient: Cathy Liu           Date of Birth: June 03, 1950           MRN: [id number] Visit Date: 07/07/2018              Requested by: Jon Gills, MD [address] [place], [place] [address] PCP: Jon Gills, MD  Chief Complaint  Patient presents with  . Right Foot - Follow-up      HPI: Patient is a 68 year old woman who presents status post right foot bunion and claw toe surgery she has no complaints other than a little bit of swelling.  Assessment & Plan: Visit Diagnoses:  1. Bunion of great toe of right foot   2. Claw toe, acquired, right     Plan: Patient will continue with scar massage recommended an arch support to further support her arch.  Follow-Up Instructions: Return if symptoms worsen or fail to improve.   Ortho Exam  Patient is alert, oriented, no adenopathy, well-dressed, normal affect, normal respiratory effort. Examination the incisions are well-healed her toes are straight there is no overlapping of the toes there is no cellulitis no signs of infection.  Patient states she has been ambulating barefoot as well as using the postoperative shoe and a flat sneaker and she has been having some arch pain most likely due to plantar fascial stress.  Imaging: No results found. No images are attached to the encounter.  Labs: No results found for: HGBA1C, ESRSEDRATE, CRP, LABURIC, REPTSTATUS, GRAMSTAIN, CULT, LABORGA   Lab Results  Component Value Date   ALBUMIN 4.4 11/11/2013    Body mass index is 21.48 kg/m.  Orders:  No orders of the defined types were placed in this encounter.  No orders of the defined types were placed in this encounter.    Procedures: No procedures performed  Clinical Data: No additional findings.  ROS:  All other systems negative, except as noted in the HPI. Review of Systems  Objective: Vital Signs: Ht 5' (1.524 m)   Wt 110 lb (49.9 kg)   BMI 21.48 kg/m   Specialty  Comments:  No specialty comments available.  PMFS History: Patient Active Problem List   Diagnosis Date Noted  . Bunion of great toe of right foot   . Claw toe, acquired, right   . Knee osteoarthritis 11/27/2013  . 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 Arthritis    back and knees  . Asthma    exercise  and cold weather induced;Albuterol inhaler prn  . Depression    takes Zoloft daily  . Headache(784.0) last one many yrs ago   takes Topamax nightly and Imitrex daily  . History of bronchitis 2010  . History of panic attacks several yrs ago  . Hyperlipidemia    borderline but no meds required  . Hypertension    takes Enalapril daily  . Joint pain   . Joint swelling left knee  . Lordosis   . Osteoarthritis   . Scoliosis   . Shortness of breath    with exertion and very cold weather  . Urinary frequency     History reviewed. No pertinent family history.  Past Surgical History:  Procedure Laterality Date  . ADENOIDECTOMY  as a child  . CHOLECYSTECTOMY    . DILATION AND CURETTAGE OF UTERUS    . JOINT REPLACEMENT Left   . KNEE ARTHROSCOPY Bilateral   .  RHINOPLASTY  as a child  . thyroid cyst drained  2006  . TONSILLECTOMY  as a child  . TOTAL KNEE ARTHROPLASTY Left 11/24/2013   DR   . TOTAL KNEE ARTHROPLASTY Left 11/24/2013   Procedure: TOTAL KNEE ARTHROPLASTY;  Surgeon: Nadara Mustard, MD;  Location: [facility];  Service: Orthopedics;  Laterality: Left;  Left Total Knee Arthroplasty, Removal Uni knee  . VAGINAL HYSTERECTOMY     with tubes and ovaries  . WEIL OSTEOTOMY Right 05/20/2018   Procedure: DOUBLE OSTEOTOMY RIGHT 1ST METATARSAL, WEIL OSTEOTOMY RIGHT 2ND METATARSAL;  Surgeon: Nadara Mustard, MD;  Location: [facility];  Service: Orthopedics;  Laterality: Right;   Social History   Occupational History  . Not on file  Tobacco Use  . Smoking status: Never Smoker  . Smokeless tobacco: Never Used  Substance and Sexual Activity   . Alcohol use: No  . Drug use: No  . Sexual activity: Not Currently    Birth control/protection: Surgical

## 2019-06-10 ENCOUNTER — Encounter: Payer: Self-pay | Admitting: Orthopedic Surgery

## 2019-06-10 ENCOUNTER — Ambulatory Visit (INDEPENDENT_AMBULATORY_CARE_PROVIDER_SITE_OTHER): Payer: Medicare Other

## 2019-06-10 ENCOUNTER — Other Ambulatory Visit: Payer: Self-pay

## 2019-06-10 ENCOUNTER — Ambulatory Visit: Payer: Medicare Other | Admitting: Orthopedic Surgery

## 2019-06-10 VITALS — Ht 60.0 in | Wt 110.0 lb

## 2019-06-10 DIAGNOSIS — M25562 Pain in left knee: Secondary | ICD-10-CM

## 2019-06-10 MED ORDER — PREDNISONE 10 MG PO TABS: 10.0000 mg | ORAL_TABLET | Freq: Every day | ORAL | 1 refills | Status: AC

## 2019-06-15 ENCOUNTER — Encounter: Payer: Self-pay | Admitting: Orthopedic Surgery

## 2019-06-15 NOTE — Progress Notes (Signed)
Office Visit Note   Patient: Cathy Liu           Date of Birth: 1951/05/12           MRN: [id number] Visit Date: 06/10/2019              Requested by: Tilman Neat, [address] [place],  [place] [address] PCP: Tilman Neat, MD  Chief Complaint  Patient presents with  . Left Knee - Pain      HPI: Patient is a 69 year old woman who is seen in for initial evaluation for acute left knee pain.  Patient states that due to the sleep medicine she was taking from [facility] she fell on her left knee she states she is having constant nonstop pain she states she has tried Voltaren gel without relief.  Assessment & Plan: Visit Diagnoses:  1. Acute pain of left knee     Plan: Recommended physical therapy and prednisone to help with the inflammation.  Patient states she would like to just start with the prednisone at this time and would like to hold on physical therapy.  Follow-Up Instructions: Return in about 3 weeks (around 07/01/2019).   Ortho Exam  Patient is alert, oriented, no adenopathy, well-dressed, normal affect, normal respiratory effort. Examination patient has full active extension of the left knee no extensor mechanism deficits.  There is no ecchymosis and bruising there is no joint effusion varus and valgus stress is stable anterior drawer is stable.  The patella tracks midline.  Imaging: No results found. No images are attached to the encounter.  Labs: No results found for: HGBA1C, ESRSEDRATE, CRP, LABURIC, REPTSTATUS, GRAMSTAIN, CULT, LABORGA   Lab Results  Component Value Date   ALBUMIN 4.4 11/11/2013    No results found for: MG No results found for: VD25OH  No results found for: PREALBUMIN CBC EXTENDED Latest Ref Rng & Units 05/14/2018 11/11/2013  WBC 4.0 - 10.5 K/uL 6.5 9.3  RBC 3.87 - 5.11 MIL/uL 4.01 4.42  HGB 12.0 - 15.0 g/dL 12.6 13.7  HCT 36.0 - 46.0 % 39.3 41.5  PLT 150 - 400 K/uL 160 233     Body mass index is 21.48  kg/m.  Orders:  Orders Placed This Encounter  Procedures  . XR Knee 1-2 Views Left   Meds ordered this encounter  Medications  . predniSONE (DELTASONE) 10 MG tablet    Sig: Take 1 tablet (10 mg total) by mouth daily with breakfast.    Dispense:  30 tablet    Refill:  1     Procedures: No procedures performed  Clinical Data: No additional findings.  ROS:  All other systems negative, except as noted in the HPI. Review of Systems  Objective: Vital Signs: Ht 5' (1.524 m)   Wt 110 lb (49.9 kg)   BMI 21.48 kg/m   Specialty Comments:  No specialty comments available.  PMFS History: Patient Active Problem List   Diagnosis Date Noted  . Bunion of great toe of right foot   . Claw toe, acquired, right   . Knee osteoarthritis 11/27/2013  . 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 Arthritis    back and knees  . Asthma    exercise  and cold weather induced;Albuterol inhaler prn  . Depression    takes Zoloft daily  . Headache(784.0) last one many yrs ago   takes Topamax nightly and Imitrex daily  . History of bronchitis 2010  .  History of panic attacks several yrs ago  . Hyperlipidemia    borderline but no meds required  . Hypertension    takes Enalapril daily  . Joint pain   . Joint swelling left knee  . Lordosis   . Osteoarthritis   . Scoliosis   . Shortness of breath    with exertion and very cold weather  . Urinary frequency     History reviewed. No pertinent family history.  Past Surgical History:  Procedure Laterality Date  . ADENOIDECTOMY  as a child  . CHOLECYSTECTOMY    . DILATION AND CURETTAGE OF UTERUS    . JOINT REPLACEMENT Left   . KNEE ARTHROSCOPY Bilateral   . RHINOPLASTY  as a child  . thyroid cyst drained  2006  . TONSILLECTOMY  as a child  . TOTAL KNEE ARTHROPLASTY Left 11/24/2013   DR Xiao Graul  . TOTAL KNEE ARTHROPLASTY Left 11/24/2013   Procedure: TOTAL KNEE ARTHROPLASTY;  Surgeon: Nadara Mustard, MD;  Location: [facility];  Service: Orthopedics;  Laterality: Left;  Left Total Knee Arthroplasty, Removal Uni knee  . VAGINAL HYSTERECTOMY     with tubes and ovaries  . WEIL OSTEOTOMY Right 05/20/2018   Procedure: DOUBLE OSTEOTOMY RIGHT 1ST METATARSAL, WEIL OSTEOTOMY RIGHT 2ND METATARSAL;  Surgeon: Nadara Mustard, MD;  Location: [facility];  Service: Orthopedics;  Laterality: Right;   Social History   Occupational History  . Not on file  Tobacco Use  . Smoking status: Never Smoker  . Smokeless tobacco: Never Used  Substance and Sexual Activity  . Alcohol use: No  . Drug use: No  . Sexual activity: Not Currently    Birth control/protection: Surgical

## 2019-07-01 ENCOUNTER — Ambulatory Visit: Payer: Medicare Other | Admitting: Orthopedic Surgery

## 2019-07-05 ENCOUNTER — Ambulatory Visit: Payer: Medicare Other | Admitting: Physician Assistant

## 2019-07-05 ENCOUNTER — Encounter: Payer: Self-pay | Admitting: Physician Assistant

## 2019-07-05 ENCOUNTER — Other Ambulatory Visit: Payer: Self-pay

## 2019-07-05 VITALS — Ht 60.0 in | Wt 110.0 lb

## 2019-07-05 DIAGNOSIS — M1712 Unilateral primary osteoarthritis, left knee: Secondary | ICD-10-CM

## 2019-07-05 NOTE — Progress Notes (Signed)
Office Visit Note   Patient: Cathy Liu           Date of Birth: 1951/03/08           MRN: [id number] Visit Date: 07/05/2019              Requested by: Tilman Neat, [place] [place],  [place] [address] PCP: Tilman Neat, MD  Chief Complaint  Patient presents with  . Left Knee - Pain      HPI: This is a pleasant 70 year old woman who follows up today for her left knee pain.  She was last seen about 3 weeks ago at which time she had taken a fall onto the front of her total knee replacement.  She had a significant amount of pain and was placed on a course of prednisone.  She states this has been helping her quite a bit.  She is doing her home exercise program   Assessment & Plan: Visit Diagnoses: No diagnosis found.  Plan: I discussed with her when she is comfortable weaning slowly off the prednisone.  If she had further flare she could contact us and we could start this again.  As this was resultant of an injury I am hoping that she will not need to do this too much  Follow-Up Instructions: No follow-ups on file.   Ortho Exam  Patient is alert, oriented, no adenopathy, well-dressed, normal affect, normal respiratory effort. Focused examination of her left knee demonstrates well-healed incision from previous surgery.  She has no effusion erythema or cellulitis.  Knee range of motion is quite comfortable.  No tenderness to palpation is appreciated today  Imaging: No results found. No images are attached to the encounter.  Labs: No results found for: HGBA1C, ESRSEDRATE, CRP, LABURIC, REPTSTATUS, GRAMSTAIN, CULT, LABORGA   Lab Results  Component Value Date   ALBUMIN 4.4 11/11/2013    No results found for: MG No results found for: VD25OH  No results found for: PREALBUMIN CBC EXTENDED Latest Ref Rng & Units 05/14/2018 11/11/2013  WBC 4.0 - 10.5 K/uL 6.5 9.3  RBC 3.87 - 5.11 MIL/uL 4.01 4.42  HGB 12.0 - 15.0 g/dL 12.6 13.7  HCT 36.0 - 46.0 % 39.3  41.5  PLT 150 - 400 K/uL 160 233     Body mass index is 21.48 kg/m.  Orders:  No orders of the defined types were placed in this encounter.  No orders of the defined types were placed in this encounter.    Procedures: No procedures performed  Clinical Data: No additional findings.  ROS:  All other systems negative, except as noted in the HPI. Review of Systems  Objective: Vital Signs: Ht 5' (1.524 m)   Wt 110 lb (49.9 kg)   BMI 21.48 kg/m   Specialty Comments:  No specialty comments available.  PMFS History: Patient Active Problem List   Diagnosis Date Noted  . Bunion of great toe of right foot   . Claw toe, acquired, right   . Knee osteoarthritis 11/27/2013  . 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 Arthritis    back and knees  . Asthma    exercise  and cold weather induced;Albuterol inhaler prn  . Depression    takes Zoloft daily  . Headache(784.0) last one many yrs ago   takes Topamax nightly and Imitrex daily  . History of bronchitis 2010  . History of panic attacks several yrs ago  .  Hyperlipidemia    borderline but no meds required  . Hypertension    takes Enalapril daily  . Joint pain   . Joint swelling left knee  . Lordosis   . Osteoarthritis   . Scoliosis   . Shortness of breath    with exertion and very cold weather  . Urinary frequency     No family history on file.  Past Surgical History:  Procedure Laterality Date  . ADENOIDECTOMY  as a child  . CHOLECYSTECTOMY    . DILATION AND CURETTAGE OF UTERUS    . JOINT REPLACEMENT Left   . KNEE ARTHROSCOPY Bilateral   . RHINOPLASTY  as a child  . thyroid cyst drained  2006  . TONSILLECTOMY  as a child  . TOTAL KNEE ARTHROPLASTY Left 11/24/2013   DR DUDA  . TOTAL KNEE ARTHROPLASTY Left 11/24/2013   Procedure: TOTAL KNEE ARTHROPLASTY;  Surgeon: Nadara Mustard, MD;  Location: [facility];  Service: Orthopedics;  Laterality: Left;  Left Total Knee  Arthroplasty, Removal Uni knee  . VAGINAL HYSTERECTOMY     with tubes and ovaries  . WEIL OSTEOTOMY Right 05/20/2018   Procedure: DOUBLE OSTEOTOMY RIGHT 1ST METATARSAL, WEIL OSTEOTOMY RIGHT 2ND METATARSAL;  Surgeon: Nadara Mustard, MD;  Location: [facility];  Service: Orthopedics;  Laterality: Right;   Social History   Occupational History  . Not on file  Tobacco Use  . Smoking status: Never Smoker  . Smokeless tobacco: Never Used  Substance and Sexual Activity  . Alcohol use: No  . Drug use: No  . Sexual activity: Not Currently    Birth control/protection: Surgical

## 2019-07-19 ENCOUNTER — Other Ambulatory Visit: Payer: Self-pay

## 2019-07-19 ENCOUNTER — Ambulatory Visit (INDEPENDENT_AMBULATORY_CARE_PROVIDER_SITE_OTHER): Payer: Medicare Other | Admitting: Physician Assistant

## 2019-07-19 ENCOUNTER — Encounter: Payer: Self-pay | Admitting: Physician Assistant

## 2019-07-19 VITALS — Ht 60.0 in | Wt 110.0 lb

## 2019-07-19 DIAGNOSIS — M1712 Unilateral primary osteoarthritis, left knee: Secondary | ICD-10-CM

## 2019-07-19 NOTE — Progress Notes (Signed)
Office Visit Note   Patient: Cathy Liu           Date of Birth: 04/23/1951           MRN: [id number] Visit Date: 07/19/2019              Requested by: Tilman Neat, [address],  [place] [address] PCP: Tilman Neat, MD  Chief Complaint  Patient presents with  . Left Knee - Pain      HPI: This is a pleasant 69 year old woman who is 9 years status post left total knee arthroplasty fairly recently she did have a fall onto the front of her knee.  X-rays did not demonstrate any damage to the prosthetic.  She was placed on a short course of prednisone and actually did quite well.  About a week ago she was in [place] and was having to stand for an extensive period of time since then she has had significant pain in her left knee that she has had to use a cane.  She tried increasing her prednisone again but this did not seem to help the pain is along the anterior knee below the patella  Assessment & Plan: Visit Diagnoses: No diagnosis found.  Plan: Patient was seen today directly by Dr. Sharol Given findings consistent with some medial instability as an MCL.  We have referred her to physical therapy for quadricep and hamstring strengthening.  She will follow-up in 1 month  Follow-Up Instructions: No follow-ups on file.   Ortho Exam  Patient is alert, oriented, no adenopathy, well-dressed, normal affect, normal respiratory effort. Left knee: Well-healed surgical incision.  There is no effusion there is no erythema and just mild soft tissue swelling she does have some medial laxity at the joint line.  This is also where she is tender  Imaging: No results found. No images are attached to the encounter.  Labs: No results found for: HGBA1C, ESRSEDRATE, CRP, LABURIC, REPTSTATUS, GRAMSTAIN, CULT, LABORGA   Lab Results  Component Value Date   ALBUMIN 4.4 11/11/2013    No results found for: MG No results found for: VD25OH  No results found for: PREALBUMIN CBC  EXTENDED Latest Ref Rng & Units 05/14/2018 11/11/2013  WBC 4.0 - 10.5 K/uL 6.5 9.3  RBC 3.87 - 5.11 MIL/uL 4.01 4.42  HGB 12.0 - 15.0 g/dL 12.6 13.7  HCT 36.0 - 46.0 % 39.3 41.5  PLT 150 - 400 K/uL 160 233     Body mass index is 21.48 kg/m.  Orders:  No orders of the defined types were placed in this encounter.  No orders of the defined types were placed in this encounter.    Procedures: No procedures performed  Clinical Data: No additional findings.  ROS:  All other systems negative, except as noted in the HPI. Review of Systems  Objective: Vital Signs: Ht 5' (1.524 m)   Wt 110 lb (49.9 kg)   BMI 21.48 kg/m   Specialty Comments:  No specialty comments available.  PMFS History: Patient Active Problem List   Diagnosis Date Noted  . Bunion of great toe of right foot   . Claw toe, acquired, right   . Knee osteoarthritis 11/27/2013  . 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 Arthritis    back and knees  . Asthma    exercise  and cold weather induced;Albuterol inhaler prn  . Depression    takes Zoloft daily  . Headache(784.0)  last one many yrs ago   takes Topamax nightly and Imitrex daily  . History of bronchitis 2010  . History of panic attacks several yrs ago  . Hyperlipidemia    borderline but no meds required  . Hypertension    takes Enalapril daily  . Joint pain   . Joint swelling left knee  . Lordosis   . Osteoarthritis   . Scoliosis   . Shortness of breath    with exertion and very cold weather  . Urinary frequency     No family history on file.  Past Surgical History:  Procedure Laterality Date  . ADENOIDECTOMY  as a child  . CHOLECYSTECTOMY    . DILATION AND CURETTAGE OF UTERUS    . JOINT REPLACEMENT Left   . KNEE ARTHROSCOPY Bilateral   . RHINOPLASTY  as a child  . thyroid cyst drained  2006  . TONSILLECTOMY  as a child  . TOTAL KNEE ARTHROPLASTY Left 11/24/2013   DR DUDA  . TOTAL KNEE  ARTHROPLASTY Left 11/24/2013   Procedure: TOTAL KNEE ARTHROPLASTY;  Surgeon: Nadara Mustard, MD;  Location: [facility];  Service: Orthopedics;  Laterality: Left;  Left Total Knee Arthroplasty, Removal Uni knee  . VAGINAL HYSTERECTOMY     with tubes and ovaries  . WEIL OSTEOTOMY Right 05/20/2018   Procedure: DOUBLE OSTEOTOMY RIGHT 1ST METATARSAL, WEIL OSTEOTOMY RIGHT 2ND METATARSAL;  Surgeon: Nadara Mustard, MD;  Location: [facility];  Service: Orthopedics;  Laterality: Right;   Social History   Occupational History  . Not on file  Tobacco Use  . Smoking status: Never Smoker  . Smokeless tobacco: Never Used  Substance and Sexual Activity  . Alcohol use: No  . Drug use: No  . Sexual activity: Not Currently    Birth control/protection: Surgical

## 2019-08-09 ENCOUNTER — Ambulatory Visit: Payer: Medicare Other | Admitting: Physician Assistant

## 2019-08-30 ENCOUNTER — Ambulatory Visit: Payer: Medicare Other | Admitting: Physician Assistant

## 2019-09-13 ENCOUNTER — Encounter: Payer: Self-pay | Admitting: Physician Assistant

## 2019-09-13 ENCOUNTER — Ambulatory Visit (INDEPENDENT_AMBULATORY_CARE_PROVIDER_SITE_OTHER): Payer: Medicare Other | Admitting: Physician Assistant

## 2019-09-13 ENCOUNTER — Other Ambulatory Visit: Payer: Self-pay

## 2019-09-13 DIAGNOSIS — M1712 Unilateral primary osteoarthritis, left knee: Secondary | ICD-10-CM | POA: Diagnosis not present

## 2019-09-13 NOTE — Progress Notes (Signed)
Office Visit Note   Patient: Cathy Liu           Date of Birth: 02-12-1951           MRN: [id number] Visit Date: 09/13/2019              Requested by: Tilman Neat, [address],  [place] [address] PCP: Tilman Neat, MD  Chief Complaint  Patient presents with  . Left Knee - Pain      HPI: This is a pleasant woman who follows up for her left knee pain.  She is status post left knee replacement many years ago.  She sustained a fall onto her knee earlier this year.  She was evaluated by Dr. Sharol Given and he thought she would do well with some physical therapy focusing on hamstring and quadricep strengthening.  She did do one physical therapy session but did not like the therapist and did not return.  She is also working with chronic pain management and when she had issues with this knee last year they did some serial blocks which really helped her she is going to continue to talk to them to see if they could repeat this  Assessment & Plan: Visit Diagnoses:  1. Primary osteoarthritis of left knee     Plan: I do think it is important that she try physical therapy and she has a new place she would like to go to.  With regards to blocks this was done on this last year and I am fine with her trying this again to see if it improves her pain she will follow-up in 4 weeks  Follow-Up Instructions: No follow-ups on file.   Ortho Exam  Patient is alert, oriented, no adenopathy, well-dressed, normal affect, normal respiratory effort. Left knee exam essentially unchanged she does have some clicking but it is not particularly painful no erythema no effusion range of motion is actually fairly good she does have some decreased patellar mobility  Imaging: No results found. No images are attached to the encounter.  Labs: No results found for: HGBA1C, ESRSEDRATE, CRP, LABURIC, REPTSTATUS, GRAMSTAIN, CULT, LABORGA   Lab Results  Component Value Date   ALBUMIN 4.4  11/11/2013    No results found for: MG No results found for: VD25OH  No results found for: PREALBUMIN CBC EXTENDED Latest Ref Rng & Units 05/14/2018 11/11/2013  WBC 4.0 - 10.5 K/uL 6.5 9.3  RBC 3.87 - 5.11 MIL/uL 4.01 4.42  HGB 12.0 - 15.0 g/dL 12.6 13.7  HCT 36.0 - 46.0 % 39.3 41.5  PLT 150 - 400 K/uL 160 233     There is no height or weight on file to calculate BMI.  Orders:  Orders Placed This Encounter  Procedures  . Ambulatory referral to Physical Therapy   No orders of the defined types were placed in this encounter.    Procedures: No procedures performed  Clinical Data: No additional findings.  ROS:  All other systems negative, except as noted in the HPI. Review of Systems  Objective: Vital Signs: There were no vitals taken for this visit.  Specialty Comments:  No specialty comments available.  PMFS History: Patient Active Problem List   Diagnosis Date Noted  . Bunion of great toe of right foot   . Claw toe, acquired, right   . Knee osteoarthritis 11/27/2013  . 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 Arthritis    back and knees  .  Asthma    exercise  and cold weather induced;Albuterol inhaler prn  . Depression    takes Zoloft daily  . Headache(784.0) last one many yrs ago   takes Topamax nightly and Imitrex daily  . History of bronchitis 2010  . History of panic attacks several yrs ago  . Hyperlipidemia    borderline but no meds required  . Hypertension    takes Enalapril daily  . Joint pain   . Joint swelling left knee  . Lordosis   . Osteoarthritis   . Scoliosis   . Shortness of breath    with exertion and very cold weather  . Urinary frequency     No family history on file.  Past Surgical History:  Procedure Laterality Date  . ADENOIDECTOMY  as a child  . CHOLECYSTECTOMY    . DILATION AND CURETTAGE OF UTERUS    . JOINT REPLACEMENT Left   . KNEE ARTHROSCOPY Bilateral   . RHINOPLASTY   as a child  . thyroid cyst drained  2006  . TONSILLECTOMY  as a child  . TOTAL KNEE ARTHROPLASTY Left 11/24/2013   DR DUDA  . TOTAL KNEE ARTHROPLASTY Left 11/24/2013   Procedure: TOTAL KNEE ARTHROPLASTY;  Surgeon: Nadara Mustard, MD;  Location: [facility];  Service: Orthopedics;  Laterality: Left;  Left Total Knee Arthroplasty, Removal Uni knee  . VAGINAL HYSTERECTOMY     with tubes and ovaries  . WEIL OSTEOTOMY Right 05/20/2018   Procedure: DOUBLE OSTEOTOMY RIGHT 1ST METATARSAL, WEIL OSTEOTOMY RIGHT 2ND METATARSAL;  Surgeon: Nadara Mustard, MD;  Location: [facility];  Service: Orthopedics;  Laterality: Right;   Social History   Occupational History  . Not on file  Tobacco Use  . Smoking status: Never Smoker  . Smokeless tobacco: Never Used  Substance and Sexual Activity  . Alcohol use: No  . Drug use: No  . Sexual activity: Not Currently    Birth control/protection: Surgical

## 2019-10-12 ENCOUNTER — Ambulatory Visit: Payer: Medicare Other | Admitting: Orthopedic Surgery

## 2019-11-09 ENCOUNTER — Other Ambulatory Visit: Payer: Self-pay

## 2019-11-09 ENCOUNTER — Ambulatory Visit: Payer: Medicare Other | Admitting: Physician Assistant

## 2019-11-09 ENCOUNTER — Ambulatory Visit: Payer: Self-pay

## 2019-11-09 ENCOUNTER — Encounter: Payer: Self-pay | Admitting: Orthopedic Surgery

## 2019-11-09 VITALS — Ht 60.0 in | Wt 110.0 lb

## 2019-11-09 DIAGNOSIS — M5442 Lumbago with sciatica, left side: Secondary | ICD-10-CM | POA: Diagnosis not present

## 2019-11-09 NOTE — Progress Notes (Signed)
Office Visit Note   Patient: Cathy Liu           Date of Birth: 08-10-1950           MRN: [id number] Visit Date: 11/09/2019              Requested by: Jon Gills, MD [address] [place],  [place] [address] PCP: Jon Gills, MD  Chief Complaint  Patient presents with  . Left Knee - Pain      HPI: Patient presents today for follow-up on her left knee pain. She status post left knee replacement. She did have a fall approximately 6 months ago. She was seen and evaluated by Dr. Lajoyce Corners who felt therapy would be helpful. She has since changed physical therapy and is starting a aquatic rehab program and she thinks this will be helpful. Her biggest concern today is of left buttock pain. She denies any radiation down her leg any paresthesias or weakness. She thinks it happened time of the fall she notices it was sometimes when she tries to sleep on her back   Assessment & Plan: Visit Diagnoses:  1. Acute left-sided low back pain with left-sided sciatica     Plan: We will try prednisone. If she does not improve I would recommend an MRI of her spine. Follow-Up Instructions: No follow-ups on file.   Ortho Exam  Patient is alert, oriented, no adenopathy, well-dressed, normal affect, normal respiratory effort. Left knee: Good range of motion no particular joint tenderness no muscle atrophy good plantar and dorsiflexion strength. With regards to her back she has some tenderness just adjacent to the sacrum. Mild straight leg raise. No radicular findings down her leg Imaging: No results found. No images are attached to the encounter.  Labs: No results found for: HGBA1C, ESRSEDRATE, CRP, LABURIC, REPTSTATUS, GRAMSTAIN, CULT, LABORGA   Lab Results  Component Value Date   ALBUMIN 4.4 11/11/2013    No results found for: MG No results found for: VD25OH  No results found for: PREALBUMIN CBC EXTENDED Latest Ref Rng & Units 05/14/2018 11/11/2013  WBC 4.0 - 10.5 K/uL 6.5  9.3  RBC 3.87 - 5.11 MIL/uL 4.01 4.42  HGB 12.0 - 15.0 g/dL 15.4 00.8  HCT 36 - 46 % 39.3 41.5  PLT 150 - 400 K/uL 160 233     Body mass index is 21.48 kg/m.  Orders:  Orders Placed This Encounter  Procedures  . XR Lumbar Spine 2-3 Views   No orders of the defined types were placed in this encounter.    Procedures: No procedures performed  Clinical Data: No additional findings.  ROS:  All other systems negative, except as noted in the HPI. Review of Systems  Objective: Vital Signs: Ht 5' (1.524 m)   Wt 110 lb (49.9 kg)   BMI 21.48 kg/m   Specialty Comments:  No specialty comments available.  PMFS History: Patient Active Problem List   Diagnosis Date Noted  . Bunion of great toe of right foot   . Claw toe, acquired, right   . Knee osteoarthritis 11/27/2013  . 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 Arthritis    back and knees  . Asthma    exercise  and cold weather induced;Albuterol inhaler prn  . Depression    takes Zoloft daily  . Headache(784.0) last one many yrs ago   takes Topamax nightly and Imitrex daily  . History of bronchitis 2010  .  History of panic attacks several yrs ago  . Hyperlipidemia    borderline but no meds required  . Hypertension    takes Enalapril daily  . Joint pain   . Joint swelling left knee  . Lordosis   . Osteoarthritis   . Scoliosis   . Shortness of breath    with exertion and very cold weather  . Urinary frequency     No family history on file.  Past Surgical History:  Procedure Laterality Date  . ADENOIDECTOMY  as a child  . CHOLECYSTECTOMY    . DILATION AND CURETTAGE OF UTERUS    . JOINT REPLACEMENT Left   . KNEE ARTHROSCOPY Bilateral   . RHINOPLASTY  as a child  . thyroid cyst drained  2006  . TONSILLECTOMY  as a child  . TOTAL KNEE ARTHROPLASTY Left 11/24/2013   DR DUDA  . TOTAL KNEE ARTHROPLASTY Left 11/24/2013   Procedure: TOTAL KNEE ARTHROPLASTY;   Surgeon: Nadara Mustard, MD;  Location: [facility];  Service: Orthopedics;  Laterality: Left;  Left Total Knee Arthroplasty, Removal Uni knee  . VAGINAL HYSTERECTOMY     with tubes and ovaries  . WEIL OSTEOTOMY Right 05/20/2018   Procedure: DOUBLE OSTEOTOMY RIGHT 1ST METATARSAL, WEIL OSTEOTOMY RIGHT 2ND METATARSAL;  Surgeon: Nadara Mustard, MD;  Location: [facility];  Service: Orthopedics;  Laterality: Right;   Social History   Occupational History  . Not on file  Tobacco Use  . Smoking status: Never Smoker  . Smokeless tobacco: Never Used  Vaping Use  . Vaping Use: Never used  Substance and Sexual Activity  . Alcohol use: No  . Drug use: No  . Sexual activity: Not Currently    Birth control/protection: Surgical

## 2020-06-01 ENCOUNTER — Ambulatory Visit: Payer: Self-pay

## 2020-06-01 ENCOUNTER — Encounter: Payer: Self-pay | Admitting: Orthopedic Surgery

## 2020-06-01 ENCOUNTER — Ambulatory Visit: Payer: Medicare Other | Admitting: Orthopedic Surgery

## 2020-06-01 DIAGNOSIS — M533 Sacrococcygeal disorders, not elsewhere classified: Secondary | ICD-10-CM

## 2020-06-01 DIAGNOSIS — M79671 Pain in right foot: Secondary | ICD-10-CM

## 2020-06-01 DIAGNOSIS — T85848S Pain due to other internal prosthetic devices, implants and grafts, sequela: Secondary | ICD-10-CM

## 2020-06-01 NOTE — Progress Notes (Signed)
Office Visit Note   Patient: Cathy Liu           Date of Birth: 11/05/1950           MRN: [id number] Visit Date: 06/01/2020              Requested by: Jon Gills, MD [address] [place],  [place] [address] PCP: Jon Gills, MD  Chief Complaint  Patient presents with  . Right Foot - Pain    Hx right foot double osteotomy  1st MT Weil osteotomy 2nd MT   . Lower Back - Pain      HPI: Patient is a 70 year old woman who presents for 2 separate issues #1 she has pain from retained hardware from the proximal osteotomy right great toe secondary to the opening wedge osteotomy.  Patient states he also fell on her buttocks several weeks ago while trying to go up and down the stairs at her home she stated that she had a large area of ecchymosis and bruising initially.  Assessment & Plan: Visit Diagnoses:  1. Coccyx pain   2. Pain in right foot   3. Pain from implanted hardware, sequela     Plan: Patient was given a felt spacer to place in the first webspace.  Discussed that we could proceed with removal of the painful screw base of the first metatarsal right foot.  Patient will set up an appointment for follow-up of her condition was to remove the screw in the office.  Patient states that her sacral pain is improving and she does not feel like she needs a donut.  Follow-Up Instructions: Return if symptoms worsen or fail to improve.   Ortho Exam  Patient is alert, oriented, no adenopathy, well-dressed, normal affect, normal respiratory effort. Examination patient does have pain with sitting pain directly over the coccyx no radicular symptoms.  Examination of the right foot she has subluxation of the great toe MTP joint.  There is a screw was prominent for the opening wedge osteotomy base of the first metatarsal.  There is no redness no cellulitis no signs of infection.  Imaging: XR Sacrum/Coccyx  Result Date: 06/01/2020 2 view radiographs of the sacrum and  coccyx shows a cortical irregularity in the coccyx.  No significant displacement.  XR Foot 2 Views Right  Result Date: 06/01/2020 2 view radiographs of the right foot shows degenerative arthritic changes of the MTP joint with subluxation of the joint.  There is a prominent screw at the base of the first metatarsal.  No images are attached to the encounter.  Labs: No results found for: HGBA1C, ESRSEDRATE, CRP, LABURIC, REPTSTATUS, GRAMSTAIN, CULT, LABORGA   Lab Results  Component Value Date   ALBUMIN 4.4 11/11/2013    No results found for: MG No results found for: VD25OH  No results found for: PREALBUMIN CBC EXTENDED Latest Ref Rng & Units 05/14/2018 11/11/2013  WBC 4.0 - 10.5 K/uL 6.5 9.3  RBC 3.87 - 5.11 MIL/uL 4.01 4.42  HGB 12.0 - 15.0 g/dL 37.1 69.6  HCT 78.9 - 38.1 % 39.3 41.5  PLT 150 - 400 K/uL 160 233     There is no height or weight on file to calculate BMI.  Orders:  Orders Placed This Encounter  Procedures  . XR Sacrum/Coccyx  . XR Foot 2 Views Right   No orders of the defined types were placed in this encounter.    Procedures: No procedures performed  Clinical Data: No additional findings.  ROS:  All other systems negative, except as noted in the HPI. Review of Systems  Objective: Vital Signs: There were no vitals taken for this visit.  Specialty Comments:  No specialty comments available.  PMFS History: Patient Active Problem List   Diagnosis Date Noted  . Bunion of great toe of right foot   . Claw toe, acquired, right   . Knee osteoarthritis 11/27/2013  . 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 Arthritis    back and knees  . Asthma    exercise  and cold weather induced;Albuterol inhaler prn  . Depression    takes Zoloft daily  . Headache(784.0) last one many yrs ago   takes Topamax nightly and Imitrex daily  . History of bronchitis 2010  . History of panic attacks several yrs ago  .  Hyperlipidemia    borderline but no meds required  . Hypertension    takes Enalapril daily  . Joint pain   . Joint swelling left knee  . Lordosis   . Osteoarthritis   . Scoliosis   . Shortness of breath    with exertion and very cold weather  . Urinary frequency     History reviewed. No pertinent family history.  Past Surgical History:  Procedure Laterality Date  . ADENOIDECTOMY  as a child  . CHOLECYSTECTOMY    . DILATION AND CURETTAGE OF UTERUS    . JOINT REPLACEMENT Left   . KNEE ARTHROSCOPY Bilateral   . RHINOPLASTY  as a child  . thyroid cyst drained  2006  . TONSILLECTOMY  as a child  . TOTAL KNEE ARTHROPLASTY Left 11/24/2013   DR Malak Orantes  . TOTAL KNEE ARTHROPLASTY Left 11/24/2013   Procedure: TOTAL KNEE ARTHROPLASTY;  Surgeon: Nadara Mustard, MD;  Location: [facility];  Service: Orthopedics;  Laterality: Left;  Left Total Knee Arthroplasty, Removal Uni knee  . VAGINAL HYSTERECTOMY     with tubes and ovaries  . WEIL OSTEOTOMY Right 05/20/2018   Procedure: DOUBLE OSTEOTOMY RIGHT 1ST METATARSAL, WEIL OSTEOTOMY RIGHT 2ND METATARSAL;  Surgeon: Nadara Mustard, MD;  Location: [facility];  Service: Orthopedics;  Laterality: Right;   Social History   Occupational History  . Not on file  Tobacco Use  . Smoking status: Never Smoker  . Smokeless tobacco: Never Used  Vaping Use  . Vaping Use: Never used  Substance and Sexual Activity  . Alcohol use: No  . Drug use: No  . Sexual activity: Not Currently    Birth control/protection: Surgical

## 2020-06-22 ENCOUNTER — Ambulatory Visit: Payer: Medicare Other | Admitting: Orthopedic Surgery

## 2020-06-22 DIAGNOSIS — T85848S Pain due to other internal prosthetic devices, implants and grafts, sequela: Secondary | ICD-10-CM | POA: Diagnosis not present

## 2020-06-22 MED ORDER — CEPHALEXIN 500 MG PO CAPS: 500.0000 mg | ORAL_CAPSULE | Freq: Three times a day (TID) | ORAL | 0 refills | Status: DC

## 2020-06-27 ENCOUNTER — Encounter: Payer: Self-pay | Admitting: Orthopedic Surgery

## 2020-06-27 NOTE — Progress Notes (Signed)
Office Visit Note   Patient: Cathy Liu           Date of Birth: Sep 24, 1950           MRN: [id number] Visit Date: 06/22/2020              Requested by: Jon Gills, MD [address] [place],  [place] [address] PCP: Jon Gills, MD  Chief Complaint  Patient presents with  . Right Foot - Pain      HPI: Patient is a 70 year old woman who is status post proximal opening wedge osteotomy for bunion deformity patient complains of pain from a backing out screw presents at this time for screw removal.  Assessment & Plan: Visit Diagnoses:  1. Pain from implanted hardware, sequela     Plan: Screw was removed without complications she is given a prescription for Keflex 500 mg 3 times a day.  Follow-Up Instructions: Return in about 1 week (around 06/29/2020).   Ortho Exam  Patient is alert, oriented, no adenopathy, well-dressed, normal affect, normal respiratory effort. Examination patient has a prominent screw proximally right foot. After informed consent the foot was sterilely prepped using Betadine locally anesthetized with 1 cc of 1% lidocaine plain a 15 blade knife was used to incise the skin and a needle driver was used to grab the screw head and unscrew the screw without complications. Sterile dressing was applied patient tolerated this well.  Imaging: No results found. No images are attached to the encounter.  Labs: No results found for: HGBA1C, ESRSEDRATE, CRP, LABURIC, REPTSTATUS, GRAMSTAIN, CULT, LABORGA   Lab Results  Component Value Date   ALBUMIN 4.4 11/11/2013    No results found for: MG No results found for: VD25OH  No results found for: PREALBUMIN CBC EXTENDED Latest Ref Rng & Units 05/14/2018 11/11/2013  WBC 4.0 - 10.5 K/uL 6.5 9.3  RBC 3.87 - 5.11 MIL/uL 4.01 4.42  HGB 12.0 - 15.0 g/dL 10.1 75.1  HCT 02.5 - 85.2 % 39.3 41.5  PLT 150 - 400 K/uL 160 233     There is no height or weight on file to calculate BMI.  Orders:  No orders  of the defined types were placed in this encounter.  Meds ordered this encounter  Medications  . cephALEXin (KEFLEX) 500 MG capsule    Sig: Take 1 capsule (500 mg total) by mouth 3 (three) times daily.    Dispense:  15 capsule    Refill:  0     Procedures: No procedures performed  Clinical Data: No additional findings.  ROS:  All other systems negative, except as noted in the HPI. Review of Systems  Objective: Vital Signs: There were no vitals taken for this visit.  Specialty Comments:  No specialty comments available.  PMFS History: Patient Active Problem List   Diagnosis Date Noted  . Bunion of great toe of right foot   . Claw toe, acquired, right   . Knee osteoarthritis 11/27/2013  . 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 Arthritis    back and knees  . Asthma    exercise  and cold weather induced;Albuterol inhaler prn  . Depression    takes Zoloft daily  . Headache(784.0) last one many yrs ago   takes Topamax nightly and Imitrex daily  . History of bronchitis 2010  . History of panic attacks several yrs ago  . Hyperlipidemia    borderline but no meds required  .  Hypertension    takes Enalapril daily  . Joint pain   . Joint swelling left knee  . Lordosis   . Osteoarthritis   . Scoliosis   . Shortness of breath    with exertion and very cold weather  . Urinary frequency     History reviewed. No pertinent family history.  Past Surgical History:  Procedure Laterality Date  . ADENOIDECTOMY  as a child  . CHOLECYSTECTOMY    . DILATION AND CURETTAGE OF UTERUS    . JOINT REPLACEMENT Left   . KNEE ARTHROSCOPY Bilateral   . RHINOPLASTY  as a child  . thyroid cyst drained  2006  . TONSILLECTOMY  as a child  . TOTAL KNEE ARTHROPLASTY Left 11/24/2013   DR Elajah Kunsman  . TOTAL KNEE ARTHROPLASTY Left 11/24/2013   Procedure: TOTAL KNEE ARTHROPLASTY;  Surgeon: Nadara Mustard, MD;  Location[facility];  Service: Orthopedics;   Laterality: Left;  Left Total Knee Arthroplasty, Removal Uni knee  . VAGINAL HYSTERECTOMY     with tubes and ovaries  . WEIL OSTEOTOMY Right 05/20/2018   Procedure: DOUBLE OSTEOTOMY RIGHT 1ST METATARSAL, WEIL OSTEOTOMY RIGHT 2ND METATARSAL;  Surgeon: Nadara Mustard, MD;  Location[facility];  Service: Orthopedics;  Laterality: Right;   Social History   Occupational History  . Not on file  Tobacco Use  . Smoking status: Never Smoker  . Smokeless tobacco: Never Used  Vaping Use  . Vaping Use: Never used  Substance and Sexual Activity  . Alcohol use: No  . Drug use: No  . Sexual activity: Not Currently    Birth control/protection: Surgical

## 2020-06-29 ENCOUNTER — Ambulatory Visit: Payer: Medicare Other | Admitting: Orthopedic Surgery

## 2021-02-05 ENCOUNTER — Ambulatory Visit: Payer: Medicare Other | Admitting: Orthopedic Surgery

## 2021-02-05 ENCOUNTER — Other Ambulatory Visit: Payer: Self-pay

## 2021-02-05 ENCOUNTER — Ambulatory Visit: Payer: Self-pay

## 2021-02-05 DIAGNOSIS — Z96652 Presence of left artificial knee joint: Secondary | ICD-10-CM

## 2021-02-05 DIAGNOSIS — M25562 Pain in left knee: Secondary | ICD-10-CM

## 2021-02-05 DIAGNOSIS — G8929 Other chronic pain: Secondary | ICD-10-CM

## 2021-03-06 ENCOUNTER — Encounter: Payer: Self-pay | Admitting: Orthopedic Surgery

## 2021-03-06 NOTE — Progress Notes (Signed)
Office Visit Note   Patient: Cathy Liu           Date of Birth: 12/14/1950           MRN: [id number] Visit Date: 02/05/2021              Requested by: Jon Gills, MD [address] [place],  [place] [address] PCP: Jon Gills, MD  No chief complaint on file.     HPI: Patient is a 70 year old woman who presents complaining of left knee pain.  She states the tendon gets stuck under her kneecap.  She states that she has tried aquatic therapy but it was too cold.  Patient is currently not going to therapy.  She has pain anteriorly below the patella.  Assessment & Plan: Visit Diagnoses:  1. Chronic pain of left knee   2. Total knee replacement status, left     Plan: Patient is given a handwritten prescription so she can go to therapy near her home.  Will follow-up as needed.  Follow-Up Instructions: Return if symptoms worsen or fail to improve.   Ortho Exam  Patient is alert, oriented, no adenopathy, well-dressed, normal affect, normal respiratory effort. Examination patient has good range of motion of the left total knee arthroplasty there is no redness no cellulitis the collaterals and cruciates are stable.  The patella tracks midline.  The patella tendon is nontender to palpation she does have some tenderness to palpation over the medial and lateral joint lines.  Imaging: No results found. No images are attached to the encounter.  Labs: No results found for: HGBA1C, ESRSEDRATE, CRP, LABURIC, REPTSTATUS, GRAMSTAIN, CULT, LABORGA   Lab Results  Component Value Date   ALBUMIN 4.4 11/11/2013    No results found for: MG No results found for: VD25OH  No results found for: PREALBUMIN CBC EXTENDED Latest Ref Rng & Units 05/14/2018 11/11/2013  WBC 4.0 - 10.5 K/uL 6.5 9.3  RBC 3.87 - 5.11 MIL/uL 4.01 4.42  HGB 12.0 - 15.0 g/dL 24.5 80.9  HCT 98.3 - 38.2 % 39.3 41.5  PLT 150 - 400 K/uL 160 233     There is no height or weight on file to calculate  BMI.  Orders:  Orders Placed This Encounter  Procedures   XR Knee 1-2 Views Left   No orders of the defined types were placed in this encounter.    Procedures: No procedures performed  Clinical Data: No additional findings.  ROS:  All other systems negative, except as noted in the HPI. Review of Systems  Objective: Vital Signs: There were no vitals taken for this visit.  Specialty Comments:  No specialty comments available.  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   Past Medical History:  Diagnosis Date   Arthritis    back and knees   Asthma    exercise  and cold weather induced;Albuterol inhaler prn   Depression    takes Zoloft daily   Headache(784.0) last one many yrs ago   takes Topamax nightly and Imitrex daily   History of bronchitis 2010   History of panic attacks several yrs ago   Hyperlipidemia    borderline but no meds required   Hypertension    takes Enalapril daily   Joint pain    Joint swelling left  knee   Lordosis    Osteoarthritis    Scoliosis    Shortness of breath    with exertion and very cold weather   Urinary frequency     History reviewed. No pertinent family history.  Past Surgical History:  Procedure Laterality Date   ADENOIDECTOMY  as a child   CHOLECYSTECTOMY     DILATION AND CURETTAGE OF UTERUS     JOINT REPLACEMENT Left    KNEE ARTHROSCOPY Bilateral    RHINOPLASTY  as a child   thyroid cyst drained  2006   TONSILLECTOMY  as a child   TOTAL KNEE ARTHROPLASTY Left 11/24/2013   DR Monserath Neff   TOTAL KNEE ARTHROPLASTY Left 11/24/2013   Procedure: TOTAL KNEE ARTHROPLASTY;  Surgeon: Nadara Mustard, MD;  Location: [facility];  Service: Orthopedics;  Laterality: Left;  Left Total Knee Arthroplasty, Removal Uni knee   VAGINAL HYSTERECTOMY     with tubes and ovaries   WEIL  OSTEOTOMY Right 05/20/2018   Procedure: DOUBLE OSTEOTOMY RIGHT 1ST METATARSAL, WEIL OSTEOTOMY RIGHT 2ND METATARSAL;  Surgeon: Nadara Mustard, MD;  Location: [facility];  Service: Orthopedics;  Laterality: Right;   Social History   Occupational History   Not on file  Tobacco Use   Smoking status: Never   Smokeless tobacco: Never  Vaping Use   Vaping Use: Never used  Substance and Sexual Activity   Alcohol use: No   Drug use: No   Sexual activity: Not Currently    Birth control/protection: Surgical

## 2021-03-19 ENCOUNTER — Ambulatory Visit: Payer: Medicare Other | Admitting: Orthopedic Surgery

## 2021-11-05 ENCOUNTER — Ambulatory Visit: Payer: Medicare Other | Admitting: Orthopedic Surgery

## 2021-11-12 ENCOUNTER — Ambulatory Visit (INDEPENDENT_AMBULATORY_CARE_PROVIDER_SITE_OTHER): Payer: Medicare Other

## 2021-11-12 ENCOUNTER — Ambulatory Visit: Payer: Medicare Other | Admitting: Orthopedic Surgery

## 2021-11-12 DIAGNOSIS — M25562 Pain in left knee: Secondary | ICD-10-CM | POA: Diagnosis not present

## 2021-11-12 DIAGNOSIS — G8929 Other chronic pain: Secondary | ICD-10-CM

## 2021-11-14 ENCOUNTER — Encounter: Payer: Self-pay | Admitting: Orthopedic Surgery

## 2021-11-14 NOTE — Progress Notes (Signed)
Office Visit Note   Patient: Cathy Liu           Date of Birth: 10/16/1950           MRN: [id number] Visit Date: 11/12/2021              Requested by: Jon Gills, MD [address] [place],  [place] [address] PCP: Jon Gills, MD  Chief Complaint  Patient presents with   Left Knee - Pain      HPI: Patient is a 71 year old woman who presents for left knee pain she is status post a left total knee arthroplasty in July 2015.  Patient states she has no pain with walking or with range of motion she does not use a cane.  Patient states that she had been going to a pain clinic in [place] but has stopped this and is weaning off her pain medicine.  Patient states she has bruising on the left shin and has a knot over the tibia she denies any falls.  She states left knee pain has been present for a month.  Assessment & Plan: Visit Diagnoses:  1. Chronic pain of left knee     Plan: Patient Snodderly tibia appears to be consistent with a clot in a superficial vein which is also consistent with the bruising she had over the tibia.  Follow-Up Instructions: No follow-ups on file.   Ortho Exam  Patient is alert, oriented, no adenopathy, well-dressed, normal affect, normal respiratory effort. Examination patient has no pain with ambulation.  She has no venous stasis changes she does have varicose veins.  Calf is soft nontender no pain with dorsiflexion of the ankle no clinical signs of a DVT.  Patient does have a small palpable mass about 3 mm in diameter that is soft and mobile.  There is no ecchymosis or bruising the skin is intact without changes.  Left knee has no effusion she has full active range of motion no redness no tenderness to palpation.  Imaging: No results found. No images are attached to the encounter.  Labs: No results found for: "HGBA1C", "ESRSEDRATE", "CRP", "LABURIC", "REPTSTATUS", "GRAMSTAIN", "CULT", "LABORGA"   Lab Results  Component Value  Date   ALBUMIN 4.4 11/11/2013    No results found for: "MG" No results found for: "VD25OH"  No results found for: "PREALBUMIN"    Latest Ref Rng & Units 05/14/2018    2:01 PM 11/11/2013    2:56 PM  CBC EXTENDED  WBC 4.0 - 10.5 K/uL 6.5  9.3   RBC 3.87 - 5.11 MIL/uL 4.01  4.42   Hemoglobin 12.0 - 15.0 g/dL 76.7  20.9   HCT 47.0 - 46.0 % 39.3  41.5   Platelets 150 - 400 K/uL 160  233      There is no height or weight on file to calculate BMI.  Orders:  Orders Placed This Encounter  Procedures   XR Knee 1-2 Views Left   No orders of the defined types were placed in this encounter.    Procedures: No procedures performed  Clinical Data: No additional findings.  ROS:  All other systems negative, except as noted in the HPI. Review of Systems  Objective: Vital Signs: There were no vitals taken for this visit.  Specialty Comments:  No specialty comments available.  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   Past Medical History:  Diagnosis Date   Arthritis    back and knees   Asthma    exercise  and cold weather induced;Albuterol inhaler prn   Depression    takes Zoloft daily   Headache(784.0) last one many yrs ago   takes Topamax nightly and Imitrex daily   History of bronchitis 2010   History of panic attacks several yrs ago   Hyperlipidemia    borderline but no meds required   Hypertension    takes Enalapril daily   Joint pain    Joint swelling left knee   Lordosis    Osteoarthritis    Scoliosis    Shortness of breath    with exertion and very cold weather   Urinary frequency     History reviewed. No pertinent family history.  Past Surgical History:  Procedure Laterality Date   ADENOIDECTOMY  as a child   CHOLECYSTECTOMY     DILATION AND CURETTAGE OF UTERUS     JOINT  REPLACEMENT Left    KNEE ARTHROSCOPY Bilateral    RHINOPLASTY  as a child   thyroid cyst drained  2006   TONSILLECTOMY  as a child   TOTAL KNEE ARTHROPLASTY Left 11/24/2013   DR Boston Cookson   TOTAL KNEE ARTHROPLASTY Left 11/24/2013   Procedure: TOTAL KNEE ARTHROPLASTY;  Surgeon: Nadara Mustard, MD;  Location[facility];  Service: Orthopedics;  Laterality: Left;  Left Total Knee Arthroplasty, Removal Uni knee   VAGINAL HYSTERECTOMY     with tubes and ovaries   WEIL OSTEOTOMY Right 05/20/2018   Procedure: DOUBLE OSTEOTOMY RIGHT 1ST METATARSAL, WEIL OSTEOTOMY RIGHT 2ND METATARSAL;  Surgeon: Nadara Mustard, MD;  Location: [facility];  Service: Orthopedics;  Laterality: Right;   Social History   Occupational History   Not on file  Tobacco Use   Smoking status: Never   Smokeless tobacco: Never  Vaping Use   Vaping Use: Never used  Substance and Sexual Activity   Alcohol use: No   Drug use: No   Sexual activity: Not Currently    Birth control/protection: Surgical

## 2021-12-26 ENCOUNTER — Ambulatory Visit: Payer: Medicare Other | Admitting: Family

## 2022-02-05 ENCOUNTER — Ambulatory Visit (INDEPENDENT_AMBULATORY_CARE_PROVIDER_SITE_OTHER): Payer: Medicare Other

## 2022-02-05 ENCOUNTER — Ambulatory Visit (INDEPENDENT_AMBULATORY_CARE_PROVIDER_SITE_OTHER): Payer: Medicare Other | Admitting: Family

## 2022-02-05 DIAGNOSIS — G8929 Other chronic pain: Secondary | ICD-10-CM

## 2022-02-05 DIAGNOSIS — M25562 Pain in left knee: Secondary | ICD-10-CM

## 2022-02-05 DIAGNOSIS — M21611 Bunion of right foot: Secondary | ICD-10-CM | POA: Diagnosis not present

## 2022-02-05 MED ORDER — HYDROCODONE-ACETAMINOPHEN 5-325 MG PO TABS: 1.0000 | ORAL_TABLET | Freq: Four times a day (QID) | ORAL | 0 refills | Status: DC | PRN

## 2022-02-05 NOTE — Progress Notes (Unsigned)
Office Visit Note   Patient: Cathy Liu           Date of Birth: 1950/08/24           MRN: [id number] Visit Date: 02/05/2022              Requested by: Jon Gills, MD [address] [place],  [place] [address] PCP: Jon Gills, MD  Chief Complaint  Patient presents with  . Left Knee - Pain      HPI: ***  Assessment & Plan: Visit Diagnoses:  1. Chronic pain of left knee   2. Bunion of great toe of right foot     Plan: ***  Follow-Up Instructions: No follow-ups on file.   Ortho Exam  Patient is alert, oriented, no adenopathy, well-dressed, normal affect, normal respiratory effort. ***  Imaging: No results found. No images are attached to the encounter.  Labs: No results found for: "HGBA1C", "ESRSEDRATE", "CRP", "LABURIC", "REPTSTATUS", "GRAMSTAIN", "CULT", "LABORGA"   Lab Results  Component Value Date   ALBUMIN 4.4 11/11/2013    No results found for: "MG" No results found for: "VD25OH"  No results found for: "PREALBUMIN"    Latest Ref Rng & Units 05/14/2018    2:01 PM 11/11/2013    2:56 PM  CBC EXTENDED  WBC 4.0 - 10.5 K/uL 6.5  9.3   RBC 3.87 - 5.11 MIL/uL 4.01  4.42   Hemoglobin 12.0 - 15.0 g/dL 81.8  29.9   HCT 37.1 - 46.0 % 39.3  41.5   Platelets 150 - 400 K/uL 160  233      There is no height or weight on file to calculate BMI.  Orders:  Orders Placed This Encounter  Procedures  . XR Knee 1-2 Views Left  . Ambulatory referral to Pain Clinic   Meds ordered this encounter  Medications  . HYDROcodone-acetaminophen (NORCO/VICODIN) 5-325 MG tablet    Sig: Take 1 tablet by mouth every 6 (six) hours as needed for moderate pain.    Dispense:  30 tablet    Refill:  0     Procedures: No procedures performed  Clinical Data: No additional findings.  ROS:  All other systems negative, except as noted in the HPI. Review of Systems  Objective: Vital Signs: There were no vitals taken for this visit.  Specialty  Comments:  No specialty comments available.  PMFS History: Patient Active Problem List   Diagnosis Date Noted  . Bunion of great toe of right foot   . Claw toe, acquired, right   . Knee osteoarthritis 11/27/2013  . 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 Arthritis    back and knees  . Asthma    exercise  and cold weather induced;Albuterol inhaler prn  . Depression    takes Zoloft daily  . Headache(784.0) last one many yrs ago   takes Topamax nightly and Imitrex daily  . History of bronchitis 2010  . History of panic attacks several yrs ago  . Hyperlipidemia    borderline but no meds required  . Hypertension    takes Enalapril daily  . Joint pain   . Joint swelling left knee  . Lordosis   . Osteoarthritis   . Scoliosis   . Shortness of breath    with exertion and very cold weather  . Urinary frequency     No family history on file.  Past Surgical History:  Procedure Laterality Date  .  ADENOIDECTOMY  as a child  . CHOLECYSTECTOMY    . DILATION AND CURETTAGE OF UTERUS    . JOINT REPLACEMENT Left   . KNEE ARTHROSCOPY Bilateral   . RHINOPLASTY  as a child  . thyroid cyst drained  2006  . TONSILLECTOMY  as a child  . TOTAL KNEE ARTHROPLASTY Left 11/24/2013   DR DUDA  . TOTAL KNEE ARTHROPLASTY Left 11/24/2013   Procedure: TOTAL KNEE ARTHROPLASTY;  Surgeon: Newt Minion, MD;  Location: [facility];  Service: Orthopedics;  Laterality: Left;  Left Total Knee Arthroplasty, Removal Uni knee  . VAGINAL HYSTERECTOMY     with tubes and ovaries  . WEIL OSTEOTOMY Right 05/20/2018   Procedure: DOUBLE OSTEOTOMY RIGHT 1ST METATARSAL, WEIL OSTEOTOMY RIGHT 2ND METATARSAL;  Surgeon: Newt Minion, MD;  Location: [facility];  Service: Orthopedics;  Laterality: Right;   Social History   Occupational History  . Not on file  Tobacco Use  . Smoking status: Never  . Smokeless tobacco: Never  Vaping Use  . Vaping Use: Never used  Substance and Sexual  Activity  . Alcohol use: No  . Drug use: No  . Sexual activity: Not Currently    Birth control/protection: Surgical

## 2022-02-15 ENCOUNTER — Telehealth: Payer: Self-pay | Admitting: Family

## 2022-02-15 ENCOUNTER — Encounter: Payer: Self-pay | Admitting: *Deleted

## 2022-02-15 ENCOUNTER — Other Ambulatory Visit (HOSPITAL_BASED_OUTPATIENT_CLINIC_OR_DEPARTMENT_OTHER): Payer: Self-pay | Admitting: Orthopaedic Surgery

## 2022-02-15 DIAGNOSIS — M21611 Bunion of right foot: Secondary | ICD-10-CM

## 2022-02-15 MED ORDER — HYDROCODONE-ACETAMINOPHEN 5-325 MG PO TABS: 1.0000 | ORAL_TABLET | Freq: Four times a day (QID) | ORAL | 0 refills | Status: DC | PRN

## 2022-02-15 NOTE — Telephone Encounter (Signed)
Pt called requesting a refill of vicoden. Please send to pharmacy on file . Pt phone number is

## 2022-02-15 NOTE — Telephone Encounter (Signed)
Patient called needing Rx refilled Vicodin. The number to contact patient is [phone]

## 2022-02-15 NOTE — Telephone Encounter (Signed)
This is a duplicate message.

## 2022-02-15 NOTE — Telephone Encounter (Signed)
Pt has a h/o left knee arthroplasty and hydrocodone last filled 02/05/22 #30   Dr. Sharol Given and Junie Panning are out of the office today, can you please assist with refill?

## 2022-02-26 ENCOUNTER — Telehealth: Payer: Self-pay | Admitting: Family

## 2022-02-26 NOTE — Telephone Encounter (Signed)
Patient called. Says the pain clinic did not give her a RX for pain medication. Would like something called in for her pain. Her call back number is [phone]

## 2022-02-26 NOTE — Telephone Encounter (Signed)
I called pt and she states that she still has left knee pain. Pt had Hydrocodone 5/325 filled #30 on 02/05/22 and 02/15/2022  Pt states that she was seen in pain clinic in [place] yesterday and she states that they will not give her Vicodin. I advised that we can not give this to her either but could discuss possible anti inflammatory medication with Dr. Sharol Given as per his last note and call her back with recommendations.

## 2022-02-27 NOTE — Telephone Encounter (Signed)
I called pt to advise of message below. She states that she is not interested in seeing the pain clinic she was referred to and she will come up with something on her own.

## 2022-08-15 ENCOUNTER — Ambulatory Visit: Payer: Medicare Other | Admitting: Orthopedic Surgery

## 2022-08-15 ENCOUNTER — Other Ambulatory Visit (INDEPENDENT_AMBULATORY_CARE_PROVIDER_SITE_OTHER): Payer: Medicare Other

## 2022-08-15 DIAGNOSIS — M5441 Lumbago with sciatica, right side: Secondary | ICD-10-CM

## 2022-08-15 DIAGNOSIS — G8929 Other chronic pain: Secondary | ICD-10-CM

## 2022-08-15 DIAGNOSIS — M25562 Pain in left knee: Secondary | ICD-10-CM | POA: Diagnosis not present

## 2022-08-16 ENCOUNTER — Encounter: Payer: Self-pay | Admitting: Orthopedic Surgery

## 2022-08-16 NOTE — Progress Notes (Signed)
Office Visit Note   Patient: Cathy Liu           Date of Birth: 1951-03-20           MRN: [id number] Visit Date: 08/15/2022              Requested by: Jon Gillsassidy-Vu, Lisa, MD [address] [place],  [place] [id number] PCP: Jon Gillsassidy-Vu, Lisa, MD  Chief Complaint  Patient presents with   Right Knee - Pain   Right Hip - Pain   Left Knee - Pain      HPI: Patient is a 72 year old woman who has chronic lower back pain radiating from the buttocks down the right leg.  Patient complains of anterior knee pain over her total knee arthroplasty.  She states she has done physical therapy in the past without relief.  She has recently tried Naprosyn as well as prednisone without relief.  Assessment & Plan: Visit Diagnoses:  1. Chronic right-sided low back pain with right-sided sciatica   2. Chronic pain of left knee     Plan: Follow-Up Instructions:  Will obtain an MRI scan of her lumbar spine to see if an epidural steroid could be helpful.  Return if symptoms worsen or fail to improve.   Ortho Exam  Patient is alert, oriented, no adenopathy, well-dressed, normal affect, normal respiratory effort. Examination patient sits leaning to the left for her right buttocks pain.  Patient has recently gone to [facility] emergency room for the radicular symptoms for which she was tried on prednisone.  Examination of the left knee patient has laxity of the lateral collateral ligament she has full extension and flexion actively.  Laxity most likely secondary to subsidence of the femoral component.  There is no effusion no redness.Patient has a negative straight leg raise on the right no focal motor weakness.   Imaging: No results found. No images are attached to the encounter.  Labs: No results found for: "HGBA1C", "ESRSEDRATE", "CRP", "LABURIC", "REPTSTATUS", "GRAMSTAIN", "CULT", "LABORGA"   Lab Results  Component Value Date   ALBUMIN 4.4 11/11/2013    No results found for: "MG" No  results found for: "VD25OH"  No results found for: "PREALBUMIN"    Latest Ref Rng & Units 05/14/2018    2:01 PM 11/11/2013    2:56 PM  CBC EXTENDED  WBC 4.0 - 10.5 K/uL 6.5  9.3   RBC 3.87 - 5.11 MIL/uL 4.01  4.42   Hemoglobin 12.0 - 15.0 g/dL 19.112.6  47.813.7   HCT 29.536.0 - 46.0 % 39.3  41.5   Platelets 150 - 400 K/uL 160  233      There is no height or weight on file to calculate BMI.  Orders:  Orders Placed This Encounter  Procedures   XR Knee 1-2 Views Right   MR Lumbar Spine w/o contrast   No orders of the defined types were placed in this encounter.    Procedures: No procedures performed  Clinical Data: No additional findings.  ROS:  All other systems negative, except as noted in the HPI. Review of Systems  Objective: Vital Signs: There were no vitals taken for this visit.  Specialty Comments:  No specialty comments available.  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   Past Medical History:  Diagnosis Date  Arthritis    back and knees   Asthma    exercise  and cold weather induced;Albuterol inhaler prn   Depression    takes Zoloft daily   Headache(784.0) last one many yrs ago   takes Topamax nightly and Imitrex daily   History of bronchitis 2010   History of panic attacks several yrs ago   Hyperlipidemia    borderline but no meds required   Hypertension    takes Enalapril daily   Joint pain    Joint swelling left knee   Lordosis    Osteoarthritis    Scoliosis    Shortness of breath    with exertion and very cold weather   Urinary frequency     History reviewed. No pertinent family history.  Past Surgical History:  Procedure Laterality Date   ADENOIDECTOMY  as a child   CHOLECYSTECTOMY     DILATION AND CURETTAGE OF UTERUS     JOINT REPLACEMENT Left    KNEE ARTHROSCOPY  Bilateral    RHINOPLASTY  as a child   thyroid cyst drained  2006   TONSILLECTOMY  as a child   TOTAL KNEE ARTHROPLASTY Left 11/24/2013   DR Zarayah Lanting   TOTAL KNEE ARTHROPLASTY Left 11/24/2013   Procedure: TOTAL KNEE ARTHROPLASTY;  Surgeon: Nadara Mustard, MD;  Location: [facility] OR;  Service: Orthopedics;  Laterality: Left;  Left Total Knee Arthroplasty, Removal Uni knee   VAGINAL HYSTERECTOMY     with tubes and ovaries   WEIL OSTEOTOMY Right 05/20/2018   Procedure: DOUBLE OSTEOTOMY RIGHT 1ST METATARSAL, WEIL OSTEOTOMY RIGHT 2ND METATARSAL;  Surgeon: Nadara Mustard, MD;  Location: [facility] OR;  Service: Orthopedics;  Laterality: Right;   Social History   Occupational History   Not on file  Tobacco Use   Smoking status: Never   Smokeless tobacco: Never  Vaping Use   Vaping Use: Never used  Substance and Sexual Activity   Alcohol use: No   Drug use: No   Sexual activity: Not Currently    Birth control/protection: Surgical

## 2022-08-20 ENCOUNTER — Telehealth: Payer: Self-pay | Admitting: Orthopedic Surgery

## 2022-08-20 NOTE — Telephone Encounter (Signed)
This order was only put in on 08/15/2022 please advise pt. Thanks

## 2022-08-20 NOTE — Telephone Encounter (Signed)
Pt aware imaging will call by end of day tomorrow if she does not hear from them to give them a call,  number was given

## 2022-08-20 NOTE — Telephone Encounter (Signed)
Patient advising that she hasn't gotten a call about her MRI and would like for someone to get that scheduled ASAP , due to not feeling well. Please advise.

## 2022-08-21 ENCOUNTER — Telehealth: Payer: Self-pay | Admitting: Orthopedic Surgery

## 2022-08-21 DIAGNOSIS — M21611 Bunion of right foot: Secondary | ICD-10-CM

## 2022-08-21 MED ORDER — HYDROCODONE-ACETAMINOPHEN 5-325 MG PO TABS: 1.0000 | ORAL_TABLET | Freq: Four times a day (QID) | ORAL | 0 refills | Status: DC | PRN

## 2022-08-21 NOTE — Telephone Encounter (Signed)
Patient called. She says she is in a lot of pain. Would like something called in for pain. Her call back number is [phone]

## 2022-08-21 NOTE — Telephone Encounter (Signed)
Pt was recently seen by Dr. Lajoyce Corners for right sided lumbar radiculopathy. Her MRI is pending for appointment. Pt is requesting a pain medication. She is having a hard time with pain control.

## 2022-08-27 ENCOUNTER — Telehealth: Payer: Self-pay | Admitting: Orthopedic Surgery

## 2022-08-27 ENCOUNTER — Other Ambulatory Visit: Payer: Self-pay | Admitting: Orthopedic Surgery

## 2022-08-27 MED ORDER — CYCLOBENZAPRINE HCL 10 MG PO TABS: 10.0000 mg | ORAL_TABLET | Freq: Three times a day (TID) | ORAL | 0 refills | Status: DC | PRN

## 2022-08-27 NOTE — Telephone Encounter (Signed)
Patient went to ER and stated they gave her Cyclobenzaprine (Flexeril) 4 mg Tablet -1 every 6 hours for Muscle spasms, she would like for Dr Lajoyce Corners to refill this for her please advise stated to call her if we had any questions

## 2022-08-27 NOTE — Telephone Encounter (Signed)
Pt was in thr ER 08/12/2022 for right sided LBP and then followed up in the office with [place] on 08/15/2022. She states that the Er gave her an rx for flexeril and she would like to know if you will refill this for her? Please advise.

## 2022-08-27 NOTE — Telephone Encounter (Signed)
Second attempt to reach pt no answer and no voice mail. Will hold and try again.

## 2022-08-27 NOTE — Telephone Encounter (Signed)
I tried to call pt to advise that rx has been sent to he pharm and there was no answer. Will hold and try again.

## 2022-08-28 NOTE — Telephone Encounter (Signed)
Unsuccessful reaching pt.

## 2022-08-31 ENCOUNTER — Other Ambulatory Visit: Payer: Medicare Other

## 2022-09-03 ENCOUNTER — Telehealth: Payer: Self-pay | Admitting: Orthopedic Surgery

## 2022-09-03 DIAGNOSIS — M21611 Bunion of right foot: Secondary | ICD-10-CM

## 2022-09-03 NOTE — Telephone Encounter (Signed)
Pt requesting refill. I left pharmacy fax on your desk. Last fill was 08/21/2022 please see below and advise.

## 2022-09-03 NOTE — Telephone Encounter (Signed)
Pt called requesting refills of oxycodone and flexeril. Please send to pharmacy on file. Pt phone number is [phone].

## 2022-09-04 MED ORDER — CYCLOBENZAPRINE HCL 10 MG PO TABS: 10.0000 mg | ORAL_TABLET | Freq: Three times a day (TID) | ORAL | 0 refills | Status: DC | PRN

## 2022-09-04 MED ORDER — HYDROCODONE-ACETAMINOPHEN 5-325 MG PO TABS: 1.0000 | ORAL_TABLET | Freq: Four times a day (QID) | ORAL | 0 refills | Status: DC | PRN

## 2022-09-09 ENCOUNTER — Telehealth: Payer: Self-pay | Admitting: Orthopedic Surgery

## 2022-09-09 NOTE — Telephone Encounter (Signed)
Called pt to advise that both rx for hydrocodone and flexeril was sent to the pharmacy 09/04/2022. Pt voiced understanding and will call with any questions.

## 2022-09-09 NOTE — Telephone Encounter (Signed)
Pt called about update of refills for hydrocodone and cyclobenaprine. Please send to [facility] pharmacy. Pt phone number is [phone].

## 2022-09-11 ENCOUNTER — Ambulatory Visit
Admission: RE | Admit: 2022-09-11 | Discharge: 2022-09-11 | Disposition: A | Discharge status: Home / Self Care | Payer: Medicare Other | Source: Ambulatory Visit | Attending: Orthopedic Surgery | Admitting: Orthopedic Surgery

## 2022-09-11 DIAGNOSIS — G8929 Other chronic pain: Secondary | ICD-10-CM

## 2022-09-12 ENCOUNTER — Telehealth: Payer: Self-pay | Admitting: Orthopedic Surgery

## 2022-09-12 NOTE — Telephone Encounter (Signed)
Pt called requesting a refill of hydrocodone. Please send to pharmacy on file. Pt last refill 4/24. Pt phone number is [phone]

## 2022-09-12 NOTE — Telephone Encounter (Signed)
Patient asking for a refill on her Flexeril, [facility] in [place]

## 2022-09-13 ENCOUNTER — Other Ambulatory Visit: Payer: Self-pay | Admitting: Surgical

## 2022-09-13 DIAGNOSIS — M21611 Bunion of right foot: Secondary | ICD-10-CM

## 2022-09-13 MED ORDER — CYCLOBENZAPRINE HCL 10 MG PO TABS: 10.0000 mg | ORAL_TABLET | Freq: Three times a day (TID) | ORAL | 0 refills | Status: DC | PRN

## 2022-09-13 NOTE — Addendum Note (Signed)
Addended by: Adonis Huguenin on: 09/13/2022 09:33 AM   Modules accepted: Orders

## 2022-09-13 NOTE — Telephone Encounter (Signed)
I'd hold off for now, it was just refilled recently

## 2022-09-16 ENCOUNTER — Other Ambulatory Visit: Payer: Self-pay | Admitting: Orthopedic Surgery

## 2022-09-16 DIAGNOSIS — M21611 Bunion of right foot: Secondary | ICD-10-CM

## 2022-09-16 MED ORDER — HYDROCODONE-ACETAMINOPHEN 5-325 MG PO TABS: 1.0000 | ORAL_TABLET | Freq: Four times a day (QID) | ORAL | 0 refills | Status: DC | PRN

## 2022-09-16 NOTE — Telephone Encounter (Signed)
Hydrocodone last filled 09/04/22, asking for refill

## 2022-09-17 ENCOUNTER — Other Ambulatory Visit: Payer: Self-pay | Admitting: Orthopedic Surgery

## 2022-09-17 DIAGNOSIS — G8929 Other chronic pain: Secondary | ICD-10-CM

## 2022-09-18 ENCOUNTER — Telehealth: Payer: Self-pay

## 2022-09-18 DIAGNOSIS — G8929 Other chronic pain: Secondary | ICD-10-CM

## 2022-09-18 NOTE — Telephone Encounter (Signed)
Pt had MRI L spine on 09/11/2022. Can you take a look at this please and let me know if pt needs referral to [facility]? PT? Spine referral thanks!

## 2022-09-18 NOTE — Telephone Encounter (Addendum)
Patient called concerning her MRI results for lumbar spine.  Would like a call back to discuss.  Cb# [phone].  Please advise.  Thank you.

## 2022-09-18 NOTE — Addendum Note (Signed)
Addended by: Barnie Del R on: 09/18/2022 02:41 PM   Modules accepted: Orders

## 2022-09-19 ENCOUNTER — Ambulatory Visit: Payer: Medicare Other | Admitting: Orthopedic Surgery

## 2022-09-23 ENCOUNTER — Ambulatory Visit: Payer: Medicare Other | Admitting: Physical Medicine and Rehabilitation

## 2022-09-27 ENCOUNTER — Encounter: Payer: Self-pay | Admitting: Physical Medicine and Rehabilitation

## 2022-09-27 ENCOUNTER — Ambulatory Visit: Payer: Medicare Other | Admitting: Physical Medicine and Rehabilitation

## 2022-09-27 DIAGNOSIS — M5416 Radiculopathy, lumbar region: Secondary | ICD-10-CM | POA: Diagnosis not present

## 2022-09-27 DIAGNOSIS — M47816 Spondylosis without myelopathy or radiculopathy, lumbar region: Secondary | ICD-10-CM | POA: Diagnosis not present

## 2022-09-27 DIAGNOSIS — G8929 Other chronic pain: Secondary | ICD-10-CM

## 2022-09-27 DIAGNOSIS — M5442 Lumbago with sciatica, left side: Secondary | ICD-10-CM | POA: Diagnosis not present

## 2022-09-27 NOTE — Progress Notes (Signed)
Functional Pain Scale - descriptive words and definitions  Distracting (5)    Aware of pain/able to complete some ADL's but limited by pain/sleep is affected and active distractions are only slightly useful. Moderate range order  Average Pain 4  Lower back pain on both sides that radiates into both hips and the left knee

## 2022-09-27 NOTE — Progress Notes (Unsigned)
Cathy Liu - 72 y.o. female MRN [id number]  Date of birth: 07-Jul-1950  Office Visit Note: Visit Date: 09/27/2022 PCP: Jon Gills, MD Referred by: Jon Gills, MD  Subjective: Chief Complaint  Patient presents with   Lower Back - Pain   HPI: Cathy Liu is a 72 y.o. female who comes in today per the request of Cathy Del, NP for evaluation of chronic, worsening and severe left sided lower back pain radiating to buttock and down to left knee. Sister-in-law present during our visit today. Pain ongoing for several months. Pain worsens with walking and activity. She describes pain as tightness and throbbing, currently rates as 8 out of 10. Some relief of pain with home exercise regimen, rest and use of medications. Recent lumbar MRI imaging exhibits multi level degenerative changes, slight anterolisthesis at L4-L5, moderate facet hypertrophy also noted at this level. No high grade spinal canal stenosis noted. Patient reports history of lumbar epidural steroid injections with [facility] many years ago, she reports good relief of pain with these procedures. Patient does have long standing history of chronic left knee pain, underwent left knee arthroplasty with Dr. Aldean Liu in 2015. Post left knee surgery she was treated for chronic pain by Dr. Charyl Bigger at [facility] where she underwent left knee genicular nerve ablation with minimal relief of pain. She is no longer being treated at this practice. Patient denies focal weakness, numbness and tingling. No recent trauma or falls.    Oswestry Disability Index Score 36% 10 to 20 (40%) moderate disability: The patient experiences more pain and difficulty with sitting, lifting and standing. Travel and social life are more difficult, and they may be disabled from work. Personal care, sexual activity and sleeping are not grossly affected, and the patient can usually be managed by conservative means.  Review  of Systems  Musculoskeletal:  Positive for back pain.  Neurological:  Negative for tingling, sensory change, focal weakness and weakness.   Otherwise per HPI.  Assessment & Plan: Visit Diagnoses:    ICD-10-CM   1. Chronic left-sided low back pain with left-sided sciatica  M54.42 Ambulatory referral to Physical Medicine Rehab   G89.29     2. Lumbar radiculopathy  M54.16 Ambulatory referral to Physical Medicine Rehab    3. Facet arthropathy, lumbar  M47.816 Ambulatory referral to Physical Medicine Rehab       Plan: Findings:  Chronic, worsening and severe left sided lower back pain radiating to buttock and down left anterior thigh to knee.  Patient continues to have severe pain despite good conservative therapy such as home exercise regimen, rest and use of medications.  Patient's recent lumbar MRI reviewed today using imaging and spine model.  Upon independent review of lumbar MRI imaging there is moderate facet arthropathy at L4-L5, also lateral recess narrowing noted on the left at this level.  Patient's clinical presentation and exam are consistent with L4 nerve pattern.  I discussed treatment plan in detail with patient today.  Next step is to perform diagnostic and hopefully therapeutic left L4-L5 interlaminar epidural steroid injection under fluoroscopic guidance.  If good relief of pain we can repeat this injection infrequently as needed.  Patient is not currently taking anticoagulant medication.  She has no questions regarding injection procedure at this time.  If lower back pain persists postinjection we also discussed the possibility of facet joint injections.  No red flag symptoms noted upon exam today.    Meds & Orders: No orders of  the defined types were placed in this encounter.   Orders Placed This Encounter  Procedures   Ambulatory referral to Physical Medicine Rehab    Follow-up: Return for Left L4-L5 interlaminar epidural steroid injection.   Procedures: No procedures  performed      Clinical History: CLINICAL DATA:  Right radiating pain for over 6 weeks. Pain radiates into the right buttock/leg for 3-4 weeks.   EXAM: MRI LUMBAR SPINE WITHOUT CONTRAST   TECHNIQUE: Multiplanar, multisequence MR imaging of the lumbar spine was performed. No intravenous contrast was administered.   COMPARISON:  None Available.   FINDINGS: Segmentation:  Standard.   Alignment:  Mild dextroscoliosis.  Slight L4-5 anterolisthesis.   Vertebrae: No fracture, evidence of discitis, or bone lesion. Chronic T12 superior endplate fracture.   Conus medullaris and cauda equina: Conus extends to the L1-2 level. Conus and cauda equina appear normal.   Paraspinal and other soft tissues: Negative for perispinal mass or inflammation.   Disc levels:   T12- L1: Unremarkable.   L1-L2: Mild disc height loss and bulging.   L2-L3: Disc height loss and bulging. Small right inferior foraminal protrusion. Negative facets   L3-L4: Disc narrowing and endplate degeneration. Endplate and facet spurring on both sides. Mild spinal stenosis   L4-L5: Degenerative facet spurring asymmetric to the right. Hypertrophy is moderate to bulky. Circumferential disc bulging. The canal and foramina are patent   L5-S1:Degenerative facet spurring asymmetric to the right. Circumferential disc bulging. No neural compression.   IMPRESSION: 1. Generalized lumbar spine degeneration with scoliosis and mild L4-5 anterolisthesis. 2. Up to moderate foraminal narrowing on the symptomatic right side at L5-S1. Diffusely patent spinal canal.     Electronically Signed   By: Tiburcio Pea M.D.   On: 09/15/2022 18:11   She reports that she has never smoked. She has never used smokeless tobacco. No results for input(s): "HGBA1C", "LABURIC" in the last 8760 hours.  Objective:  VS:  HT:    WT:   BMI:     BP:   HR: bpm  TEMP: ( )  RESP:  Physical Exam Vitals and nursing note reviewed.  HENT:      Head: Normocephalic and atraumatic.     Right Ear: External ear normal.     Left Ear: External ear normal.     Nose: Nose normal.     Mouth/Throat:     Mouth: Mucous membranes are moist.  Eyes:     Extraocular Movements: Extraocular movements intact.  Cardiovascular:     Rate and Rhythm: Normal rate.     Pulses: Normal pulses.  Pulmonary:     Effort: Pulmonary effort is normal.  Abdominal:     General: Abdomen is flat. There is no distension.  Musculoskeletal:        General: Tenderness present.     Cervical back: Normal range of motion.     Comments: Patient is slow to rise from seated position to standing. Good lumbar range of motion. No pain noted with facet loading. 5/5 strength noted with bilateral hip flexion, knee flexion/extension, ankle dorsiflexion/plantarflexion and EHL. No clonus noted bilaterally. No pain upon palpation of greater trochanters. No pain with internal/external rotation of bilateral hips. Sensation intact bilaterally. Negative slump test bilaterally. Ambulates without aid, gait steady.     Skin:    General: Skin is warm and dry.     Capillary Refill: Capillary refill takes less than 2 seconds.  Neurological:     General: No focal deficit present.  Mental Status: She is alert and oriented to person, place, and time.  Psychiatric:        Mood and Affect: Mood normal.        Behavior: Behavior normal.     Ortho Exam  Imaging: No results found.  Past Medical/Family/Surgical/Social History: Medications & Allergies reviewed per EMR, new medications updated. 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   Past Medical History:  Diagnosis Date   Arthritis    back and knees   Asthma    exercise  and cold weather induced;Albuterol inhaler prn   Depression    takes Zoloft daily   Headache(784.0) last  one many yrs ago   takes Topamax nightly and Imitrex daily   History of bronchitis 2010   History of panic attacks several yrs ago   Hyperlipidemia    borderline but no meds required   Hypertension    takes Enalapril daily   Joint pain    Joint swelling left knee   Lordosis    Osteoarthritis    Scoliosis    Shortness of breath    with exertion and very cold weather   Urinary frequency    History reviewed. No pertinent family history. Past Surgical History:  Procedure Laterality Date   ADENOIDECTOMY  as a child   CHOLECYSTECTOMY     DILATION AND CURETTAGE OF UTERUS     JOINT REPLACEMENT Left    KNEE ARTHROSCOPY Bilateral    RHINOPLASTY  as a child   thyroid cyst drained  2006   TONSILLECTOMY  as a child   TOTAL KNEE ARTHROPLASTY Left 11/24/2013   DR DUDA   TOTAL KNEE ARTHROPLASTY Left 11/24/2013   Procedure: TOTAL KNEE ARTHROPLASTY;  Surgeon: Nadara Mustard, MD;  Location: [facility] OR;  Service: Orthopedics;  Laterality: Left;  Left Total Knee Arthroplasty, Removal Uni knee   VAGINAL HYSTERECTOMY     with tubes and ovaries   WEIL OSTEOTOMY Right 05/20/2018   Procedure: DOUBLE OSTEOTOMY RIGHT 1ST METATARSAL, WEIL OSTEOTOMY RIGHT 2ND METATARSAL;  Surgeon: Nadara Mustard, MD;  Location: [facility] OR;  Service: Orthopedics;  Laterality: Right;   Social History   Occupational History   Not on file  Tobacco Use   Smoking status: Never   Smokeless tobacco: Never  Vaping Use   Vaping Use: Never used  Substance and Sexual Activity   Alcohol use: No   Drug use: No   Sexual activity: Not Currently    Birth control/protection: Surgical

## 2022-09-30 ENCOUNTER — Telehealth: Payer: Self-pay | Admitting: Orthopedic Surgery

## 2022-09-30 DIAGNOSIS — M21611 Bunion of right foot: Secondary | ICD-10-CM

## 2022-09-30 NOTE — Telephone Encounter (Signed)
Patient asking for Flexeril, and Hydrocodone  refill

## 2022-10-01 MED ORDER — HYDROCODONE-ACETAMINOPHEN 5-325 MG PO TABS: 1.0000 | ORAL_TABLET | Freq: Three times a day (TID) | ORAL | 0 refills | Status: DC | PRN

## 2022-10-01 MED ORDER — CYCLOBENZAPRINE HCL 10 MG PO TABS: 10.0000 mg | ORAL_TABLET | Freq: Three times a day (TID) | ORAL | 0 refills | Status: DC | PRN

## 2022-10-01 NOTE — Addendum Note (Signed)
Addended by: Adonis Huguenin on: 10/01/2022 09:12 AM   Modules accepted: Orders

## 2022-10-08 ENCOUNTER — Telehealth: Payer: Self-pay | Admitting: Physical Medicine and Rehabilitation

## 2022-10-08 NOTE — Telephone Encounter (Signed)
Patient's sister in Law called asked if the approval has come in for the injection in patient's back? The number to contact Dennie Bible is [phone]

## 2022-10-09 NOTE — Telephone Encounter (Signed)
LVM to return call to schedule injection 

## 2022-10-17 ENCOUNTER — Telehealth: Payer: Self-pay | Admitting: Orthopedic Surgery

## 2022-10-17 NOTE — Telephone Encounter (Signed)
Duplicate message. 

## 2022-10-17 NOTE — Telephone Encounter (Signed)
Pt called requesting refills of flexeril and hydrocodone. Please send to pharmacy on file. Pt phone  number is [phone]

## 2022-10-17 NOTE — Telephone Encounter (Signed)
Pt has been treated for chronic LBP and left knee pain  last refill Flexeril 10/01/2022 #21, Hydrocodone 5/325 #21 on 10/01/2022 and 09/16/2022 please advise if you want to refill

## 2022-10-17 NOTE — Telephone Encounter (Signed)
Pt called about update of medications being sent. Explained to pt that Dr Lajoyce Corners is in clinic and I will asked Autumn F or [place] W to call pt when meds have been sent to pharmacy. Please call pt at [phone].

## 2022-10-18 MED ORDER — HYDROCODONE-ACETAMINOPHEN 5-325 MG PO TABS: 1.0000 | ORAL_TABLET | Freq: Two times a day (BID) | ORAL | 0 refills | Status: AC | PRN

## 2022-10-18 MED ORDER — CYCLOBENZAPRINE HCL 10 MG PO TABS: 10.0000 mg | ORAL_TABLET | Freq: Three times a day (TID) | ORAL | 0 refills | Status: AC | PRN

## 2022-10-21 ENCOUNTER — Other Ambulatory Visit: Payer: Self-pay

## 2022-10-21 ENCOUNTER — Ambulatory Visit (INDEPENDENT_AMBULATORY_CARE_PROVIDER_SITE_OTHER): Payer: Medicare Other | Admitting: Physical Medicine and Rehabilitation

## 2022-10-21 VITALS — BP 117/75 | HR 97

## 2022-10-21 DIAGNOSIS — M5416 Radiculopathy, lumbar region: Secondary | ICD-10-CM

## 2022-10-21 MED ORDER — METHYLPREDNISOLONE ACETATE 80 MG/ML IJ SUSP
80.0000 mg | Freq: Once | INTRAMUSCULAR | Status: AC
Start: 2022-10-21 — End: 2022-10-21
  Administered 2022-10-21: 80 mg

## 2022-10-21 NOTE — Progress Notes (Signed)
Functional Pain Scale - descriptive words and definitions  Moderate (4)   Constantly aware of pain, can complete ADLs with modification/sleep marginally affected at times/passive distraction is of no use, but active distraction gives some relief. Moderate range order  Average Pain  varies   +Driver, -BT, -Dye Allergies.  Lower back pain on left side that radiates into left leg. Walking and standing too long makes pain worse

## 2022-10-21 NOTE — Patient Instructions (Signed)

## 2022-11-04 NOTE — Progress Notes (Signed)
Cathy Liu - 72 y.o. female MRN [id number]  Date of birth: 11-30-1950  Office Visit Note: Visit Date: 10/21/2022 PCP: Jon Gills, MD Referred by: Jon Gills, MD  Subjective: Chief Complaint  Patient presents with   Lower Back - Pain   HPI:  Cathy Liu is a 72 y.o. female who comes in today at the request of Ellin Goodie, FNP for planned Left L4-5 Lumbar Interlaminar epidural steroid injection with fluoroscopic guidance.  The patient has failed conservative care including home exercise, medications, time and activity modification.  This injection will be diagnostic and hopefully therapeutic.  Please see requesting physician notes for further details and justification.   ROS Otherwise per HPI.  Assessment & Plan: Visit Diagnoses:    ICD-10-CM   1. Lumbar radiculopathy  M54.16 XR C-ARM NO REPORT    Epidural Steroid injection    methylPREDNISolone acetate (DEPO-MEDROL) injection 80 mg      Plan: No additional findings.   Meds & Orders:  Meds ordered this encounter  Medications   methylPREDNISolone acetate (DEPO-MEDROL) injection 80 mg    Orders Placed This Encounter  Procedures   XR C-ARM NO REPORT   Epidural Steroid injection    Follow-up: Return for visit to requesting provider as needed.   Procedures: No procedures performed  Lumbar Epidural Steroid Injection - Interlaminar Approach with Fluoroscopic Guidance  Patient: Cathy Liu      Date of Birth: Jan 24, 1951 MRN: [id number] PCP: Jon Gills, MD      Visit Date: 10/21/2022   Universal Protocol:     Consent Given By: the patient  Position: PRONE  Additional Comments: Vital signs were monitored before and after the procedure. Patient was prepped and draped in the usual sterile fashion. The correct patient, procedure, and site was verified.   Injection Procedure Details:   Procedure diagnoses: Lumbar radiculopathy [M54.16]   Meds Administered:  Meds ordered this  encounter  Medications   methylPREDNISolone acetate (DEPO-MEDROL) injection 80 mg     Laterality: Left  Location/Site:  L4-5  Needle: 3.5 in., 20 ga. Tuohy  Needle Placement: Paramedian epidural  Findings:   -Comments: Excellent flow of contrast into the epidural space.  Procedure Details: Using a paramedian approach from the side mentioned above, the region overlying the inferior lamina was localized under fluoroscopic visualization and the soft tissues overlying this structure were infiltrated with 4 ml. of 1% Lidocaine without Epinephrine. The Tuohy needle was inserted into the epidural space using a paramedian approach.   The epidural space was localized using loss of resistance along with counter oblique bi-planar fluoroscopic views.  After negative aspirate for air, blood, and CSF, a 2 ml. volume of Isovue-250 was injected into the epidural space and the flow of contrast was observed. Radiographs were obtained for documentation purposes.    The injectate was administered into the level noted above.   Additional Comments:  No complications occurred Dressing: 2 x 2 sterile gauze and Band-Aid    Post-procedure details: Patient was observed during the procedure. Post-procedure instructions were reviewed.  Patient left the clinic in stable condition.   Clinical History: CLINICAL DATA:  Right radiating pain for over 6 weeks. Pain radiates into the right buttock/leg for 3-4 weeks.   EXAM: MRI LUMBAR SPINE WITHOUT CONTRAST   TECHNIQUE: Multiplanar, multisequence MR imaging of the lumbar spine was performed. No intravenous contrast was administered.   COMPARISON:  None Available.   FINDINGS: Segmentation:  Standard.   Alignment:  Mild dextroscoliosis.  Slight L4-5 anterolisthesis.  Vertebrae: No fracture, evidence of discitis, or bone lesion. Chronic T12 superior endplate fracture.   Conus medullaris and cauda equina: Conus extends to the L1-2 level. Conus and  cauda equina appear normal.   Paraspinal and other soft tissues: Negative for perispinal mass or inflammation.   Disc levels:   T12- L1: Unremarkable.   L1-L2: Mild disc height loss and bulging.   L2-L3: Disc height loss and bulging. Small right inferior foraminal protrusion. Negative facets   L3-L4: Disc narrowing and endplate degeneration. Endplate and facet spurring on both sides. Mild spinal stenosis   L4-L5: Degenerative facet spurring asymmetric to the right. Hypertrophy is moderate to bulky. Circumferential disc bulging. The canal and foramina are patent   L5-S1:Degenerative facet spurring asymmetric to the right. Circumferential disc bulging. No neural compression.   IMPRESSION: 1. Generalized lumbar spine degeneration with scoliosis and mild L4-5 anterolisthesis. 2. Up to moderate foraminal narrowing on the symptomatic right side at L5-S1. Diffusely patent spinal canal.     Electronically Signed   By: Tiburcio Pea M.D.   On: 09/15/2022 18:11     Objective:  VS:  HT:    WT:   BMI:     BP:117/75  HR:97bpm  TEMP: ( )  RESP:  Physical Exam Vitals and nursing note reviewed.  Constitutional:      General: She is not in acute distress.    Appearance: Normal appearance. She is not ill-appearing.  HENT:     Head: Normocephalic and atraumatic.     Right Ear: External ear normal.     Left Ear: External ear normal.  Eyes:     Extraocular Movements: Extraocular movements intact.  Cardiovascular:     Rate and Rhythm: Normal rate.     Pulses: Normal pulses.  Pulmonary:     Effort: Pulmonary effort is normal. No respiratory distress.  Abdominal:     General: There is no distension.     Palpations: Abdomen is soft.  Musculoskeletal:        General: Tenderness present.     Cervical back: Neck supple.     Right lower leg: No edema.     Left lower leg: No edema.     Comments: Patient has good distal strength with no pain over the greater trochanters.  No  clonus or focal weakness.  Skin:    Findings: No erythema, lesion or rash.  Neurological:     General: No focal deficit present.     Mental Status: She is alert and oriented to person, place, and time.     Sensory: No sensory deficit.     Motor: No weakness or abnormal muscle tone.     Coordination: Coordination normal.  Psychiatric:        Mood and Affect: Mood normal.        Behavior: Behavior normal.      Imaging: No results found.

## 2022-11-04 NOTE — Procedures (Signed)
Lumbar Epidural Steroid Injection - Interlaminar Approach with Fluoroscopic Guidance  Patient: Cathy Liu      Date of Birth: February 16, 1951 MRN: [id number] PCP: Jon Gills, MD      Visit Date: 10/21/2022   Universal Protocol:     Consent Given By: the patient  Position: PRONE  Additional Comments: Vital signs were monitored before and after the procedure. Patient was prepped and draped in the usual sterile fashion. The correct patient, procedure, and site was verified.   Injection Procedure Details:   Procedure diagnoses: Lumbar radiculopathy [M54.16]   Meds Administered:  Meds ordered this encounter  Medications   methylPREDNISolone acetate (DEPO-MEDROL) injection 80 mg     Laterality: Left  Location/Site:  L4-5  Needle: 3.5 in., 20 ga. Tuohy  Needle Placement: Paramedian epidural  Findings:   -Comments: Excellent flow of contrast into the epidural space.  Procedure Details: Using a paramedian approach from the side mentioned above, the region overlying the inferior lamina was localized under fluoroscopic visualization and the soft tissues overlying this structure were infiltrated with 4 ml. of 1% Lidocaine without Epinephrine. The Tuohy needle was inserted into the epidural space using a paramedian approach.   The epidural space was localized using loss of resistance along with counter oblique bi-planar fluoroscopic views.  After negative aspirate for air, blood, and CSF, a 2 ml. volume of Isovue-250 was injected into the epidural space and the flow of contrast was observed. Radiographs were obtained for documentation purposes.    The injectate was administered into the level noted above.   Additional Comments:  No complications occurred Dressing: 2 x 2 sterile gauze and Band-Aid    Post-procedure details: Patient was observed during the procedure. Post-procedure instructions were reviewed.  Patient left the clinic in stable condition.

## 2022-11-07 ENCOUNTER — Encounter: Payer: Self-pay | Admitting: Physician Assistant

## 2022-11-07 ENCOUNTER — Ambulatory Visit: Payer: Medicare Other | Admitting: Physician Assistant

## 2022-11-07 DIAGNOSIS — Z96652 Presence of left artificial knee joint: Secondary | ICD-10-CM

## 2022-11-07 NOTE — Progress Notes (Signed)
Office Visit Note   Patient: Cathy Liu           Date of Birth: 1950-05-16           MRN: [id number] Visit Date: 11/07/2022              Requested by: Jon Gills, MD [address] [place],  [place] [address] PCP: Jon Gills, MD  Chief Complaint  Patient presents with   Left Knee - Pain      HPI: Cathy Liu is a pleasant 72 year old woman who is 9 years status post left total knee arthroplasty with Dr. Lajoyce Corners.  She comes in today complaining of left knee pain.  Denies any injuries or falls.  She has had this on and off for several months but it started bothering her more a few days ago.  She points to pain over the front of her knee.  She said this hurts her at night because she sleeps on her stomach because she has asthma.  She also finds that if she has been ambulating for a while later in the evening she will have pain in the knee.  Assessment & Plan: Visit Diagnoses: Left knee pain  Plan: I discussed with her that she does have some instability in the knee with the lateral collateral ligament.  I do not see anything concerning today she has no effusion she has no tenderness over the distal femur she just has tenderness over the anterior knee.  This is similar to where she has had problems before.  She did not feel like she got helped with physical therapy.  I have talked to her about wearing a knee sleeve to see if this helps with some of her stability issues.  We also discussed trying to get a eggcrate pad or pad her knee when she sleeps at night.  Follow-Up Instructions: 3 weeks with Erin or Dr. Waldron Labs Exam  Patient is alert, oriented, no adenopathy, well-dressed, normal affect, normal respiratory effort. Left knee she has no effusion no erythema well-healed surgical incision no swelling.  She is able to sustain a straight leg raise.  Does have laxity with LCL testing.  Also noted her last visit.  Strength is fair.  She has good dorsiflexion  plantarflexion of her ankle she can has good sensation.  Imaging: No results found. No images are attached to the encounter.  Labs: No results found for: "HGBA1C", "ESRSEDRATE", "CRP", "LABURIC", "REPTSTATUS", "GRAMSTAIN", "CULT", "LABORGA"   Lab Results  Component Value Date   ALBUMIN 4.4 11/11/2013    No results found for: "MG" No results found for: "VD25OH"  No results found for: "PREALBUMIN"    Latest Ref Rng & Units 05/14/2018    2:01 PM 11/11/2013    2:56 PM  CBC EXTENDED  WBC 4.0 - 10.5 K/uL 6.5  9.3   RBC 3.87 - 5.11 MIL/uL 4.01  4.42   Hemoglobin 12.0 - 15.0 g/dL 19.1  47.8   HCT 29.5 - 46.0 % 39.3  41.5   Platelets 150 - 400 K/uL 160  233      There is no height or weight on file to calculate BMI.  Orders:  No orders of the defined types were placed in this encounter.  No orders of the defined types were placed in this encounter.    Procedures: No procedures performed  Clinical Data: No additional findings.  ROS:  All other systems negative, except as noted in the HPI. Review of Systems  Objective: Vital Signs: There were no vitals taken for this visit.  Specialty Comments:  CLINICAL DATA:  Right radiating pain for over 6 weeks. Pain radiates into the right buttock/leg for 3-4 weeks.   EXAM: MRI LUMBAR SPINE WITHOUT CONTRAST   TECHNIQUE: Multiplanar, multisequence MR imaging of the lumbar spine was performed. No intravenous contrast was administered.   COMPARISON:  None Available.   FINDINGS: Segmentation:  Standard.   Alignment:  Mild dextroscoliosis.  Slight L4-5 anterolisthesis.   Vertebrae: No fracture, evidence of discitis, or bone lesion. Chronic T12 superior endplate fracture.   Conus medullaris and cauda equina: Conus extends to the L1-2 level. Conus and cauda equina appear normal.   Paraspinal and other soft tissues: Negative for perispinal mass or inflammation.   Disc levels:   T12- L1: Unremarkable.   L1-L2: Mild  disc height loss and bulging.   L2-L3: Disc height loss and bulging. Small right inferior foraminal protrusion. Negative facets   L3-L4: Disc narrowing and endplate degeneration. Endplate and facet spurring on both sides. Mild spinal stenosis   L4-L5: Degenerative facet spurring asymmetric to the right. Hypertrophy is moderate to bulky. Circumferential disc bulging. The canal and foramina are patent   L5-S1:Degenerative facet spurring asymmetric to the right. Circumferential disc bulging. No neural compression.   IMPRESSION: 1. Generalized lumbar spine degeneration with scoliosis and mild L4-5 anterolisthesis. 2. Up to moderate foraminal narrowing on the symptomatic right side at L5-S1. Diffusely patent spinal canal.     Electronically Signed   By: Tiburcio Pea M.D.   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   Past Medical History:  Diagnosis Date   Arthritis    back and knees   Asthma    exercise  and cold weather induced;Albuterol inhaler prn   Depression    takes Zoloft daily   Headache(784.0) last one many yrs ago   takes Topamax nightly and Imitrex daily   History of bronchitis 2010   History of panic attacks several yrs ago   Hyperlipidemia    borderline but no meds required   Hypertension    takes Enalapril daily   Joint pain    Joint swelling left knee   Lordosis    Osteoarthritis    Scoliosis    Shortness of breath    with exertion and very cold weather   Urinary frequency     No family history on file.  Past Surgical History:  Procedure Laterality Date   ADENOIDECTOMY  as a child   CHOLECYSTECTOMY     DILATION AND CURETTAGE OF UTERUS     JOINT REPLACEMENT Left    KNEE ARTHROSCOPY Bilateral    RHINOPLASTY  as a child   thyroid cyst drained  2006    TONSILLECTOMY  as a child   TOTAL KNEE ARTHROPLASTY Left 11/24/2013   DR DUDA   TOTAL KNEE ARTHROPLASTY Left 11/24/2013   Procedure: TOTAL KNEE ARTHROPLASTY;  Surgeon: Nadara Mustard, MD;  Location: [facility] OR;  Service: Orthopedics;  Laterality: Left;  Left Total Knee Arthroplasty, Removal Uni knee   VAGINAL HYSTERECTOMY     with tubes and ovaries   WEIL OSTEOTOMY Right 05/20/2018   Procedure: DOUBLE OSTEOTOMY RIGHT 1ST METATARSAL, WEIL OSTEOTOMY RIGHT 2ND METATARSAL;  Surgeon: Nadara Mustard, MD;  Location: [facility] OR;  Service: Orthopedics;  Laterality: Right;   Social History   Occupational History   Not on file  Tobacco Use   Smoking status: Never   Smokeless tobacco: Never  Vaping Use   Vaping Use: Never used  Substance and Sexual Activity   Alcohol use: No   Drug use: No   Sexual activity: Not Currently    Birth control/protection: Surgical

## 2022-11-08 ENCOUNTER — Telehealth: Payer: Self-pay | Admitting: Orthopedic Surgery

## 2022-11-08 NOTE — Telephone Encounter (Signed)
Last refill was 10/18/2022 #14 this pt was in the office yesterday and saw [facility] for left knee pain. Please see below and advise.

## 2022-11-08 NOTE — Telephone Encounter (Signed)
Pt called requesting refill on Hydrocodone.  please advise 

## 2022-11-11 ENCOUNTER — Telehealth: Payer: Self-pay | Admitting: Physician Assistant

## 2022-11-11 ENCOUNTER — Telehealth: Payer: Self-pay | Admitting: Family

## 2022-11-11 NOTE — Telephone Encounter (Signed)
A message has already been sent to [facility] to address tomorrow when she is in the office. We asked [place] earlier today since she recently saw pt in office, but declined and wanted Erin to be the one to take care of this refill request.

## 2022-11-11 NOTE — Telephone Encounter (Signed)
Maryanne saw this pt on 11/07/2022 please see message below and advise. Thanks!

## 2022-11-11 NOTE — Telephone Encounter (Signed)
Pt called asking for her pain medication. She states she is in severe pain and have called several times today for Dr Lajoyce Corners. I explained to pt Dr Lajoyce Corners ia on vacation and she would be the one to send it in. Please send to pharmacy on file. Pt phone number is [phone].

## 2022-11-11 NOTE — Telephone Encounter (Signed)
Noted. Messages have been sent to [facility]

## 2022-11-11 NOTE — Telephone Encounter (Signed)
Pt was wondering if [facility] could refill her pain medication since Denny Peon is not in

## 2022-11-13 ENCOUNTER — Telehealth: Payer: Self-pay | Admitting: Family

## 2022-11-13 ENCOUNTER — Telehealth: Payer: Self-pay | Admitting: Physician Assistant

## 2022-11-13 ENCOUNTER — Other Ambulatory Visit: Payer: Self-pay

## 2022-11-13 DIAGNOSIS — G8929 Other chronic pain: Secondary | ICD-10-CM

## 2022-11-13 DIAGNOSIS — M47816 Spondylosis without myelopathy or radiculopathy, lumbar region: Secondary | ICD-10-CM

## 2022-11-13 DIAGNOSIS — M5416 Radiculopathy, lumbar region: Secondary | ICD-10-CM

## 2022-11-13 NOTE — Telephone Encounter (Signed)
This is pt's fifth message in two days. Original has already been sent to provider. Closing this message out.

## 2022-11-13 NOTE — Telephone Encounter (Signed)
Pt called in again about medication refill and with hydrocodone and flexerill please advise

## 2022-11-13 NOTE — Telephone Encounter (Signed)
Can you please address this today several messages on this pt.

## 2022-11-13 NOTE — Telephone Encounter (Signed)
Patient called. She would like flexural and hydrocodone called in for her pain.

## 2022-12-17 ENCOUNTER — Ambulatory Visit: Payer: Medicare Other | Admitting: Orthopedic Surgery

## 2022-12-25 ENCOUNTER — Telehealth: Payer: Self-pay | Admitting: Orthopedic Surgery

## 2022-12-25 NOTE — Telephone Encounter (Signed)
Patient called requesting a refilled on Hydrocodone and Flexeril. The number to contact patient is [phone]

## 2022-12-25 NOTE — Telephone Encounter (Signed)
Pt called the office and was wanting a refill of her Hydrocodone and Flexeril. The pt had an appt on 12/17/2022 but she canceled this due to a conflict with another appt. The pt said that when she was in the office back in June that she saw [facility] and that she told the pt that she was not in pain and would not refill her medications. The pt states that since then she has re injured her knee and just wants a refill of her medication. I advised the pt that Dr. Lajoyce Corners has not seen her in the office since April and that we could not refill the requested medication without evaluation especially given that she has has a new injury. We also made a referral to pain management for the pt and they closed the referral due to 3 attempts to reach the pt without a call back. I advised that we are happy to see her and the pt said that she will not come to our office as long as [employer] is employed here. 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
